# Patient Record
Sex: Female | Born: 1980 | Race: White | Hispanic: Yes | Marital: Single | Smoking: Never smoker
Health system: Southern US, Community
[De-identification: ages and names within clinical notes are randomized; demographics above are authoritative.]

---

## 2018-10-19 DIAGNOSIS — H5213 Myopia, bilateral: Secondary | ICD-10-CM | POA: Diagnosis not present

## 2018-10-19 DIAGNOSIS — H40033 Anatomical narrow angle, bilateral: Secondary | ICD-10-CM | POA: Diagnosis not present

## 2018-10-19 DIAGNOSIS — H16223 Keratoconjunctivitis sicca, not specified as Sjogren's, bilateral: Secondary | ICD-10-CM | POA: Diagnosis not present

## 2018-11-10 DIAGNOSIS — H1013 Acute atopic conjunctivitis, bilateral: Secondary | ICD-10-CM | POA: Diagnosis not present

## 2019-02-09 ENCOUNTER — Ambulatory Visit: Payer: Medicaid Other | Attending: Family Medicine | Admitting: Family Medicine

## 2019-02-09 ENCOUNTER — Encounter: Payer: Self-pay | Admitting: Family Medicine

## 2019-02-09 ENCOUNTER — Other Ambulatory Visit: Payer: Self-pay

## 2019-02-09 VITALS — BP 105/73 | HR 84 | Ht 62.0 in | Wt 181.0 lb

## 2019-02-09 DIAGNOSIS — Z79899 Other long term (current) drug therapy: Secondary | ICD-10-CM | POA: Diagnosis not present

## 2019-02-09 DIAGNOSIS — K219 Gastro-esophageal reflux disease without esophagitis: Secondary | ICD-10-CM | POA: Insufficient documentation

## 2019-02-09 DIAGNOSIS — Z131 Encounter for screening for diabetes mellitus: Secondary | ICD-10-CM | POA: Diagnosis not present

## 2019-02-09 LAB — POCT GLYCOSYLATED HEMOGLOBIN (HGB A1C): HbA1c, POC (controlled diabetic range): 5.5 % (ref 0.0–7.0)

## 2019-02-09 MED ORDER — OMEPRAZOLE 40 MG PO CPDR: 40.0000 mg | DELAYED_RELEASE_CAPSULE | Freq: Every day | ORAL | 3 refills | Status: DC

## 2019-02-09 NOTE — Patient Instructions (Signed)
Opciones de alimentos para pacientes adultos con enfermedad de reflujo gastroesofgico Food Choices for Gastroesophageal Reflux Disease, Adult Si tiene enfermedad de reflujo gastroesofgico (ERGE), los alimentos que consume y los hbitos de alimentacin son muy importantes. Elegir los alimentos adecuados puede ayudar a aliviar las molestias. Piense en consultar a un especialista en nutricin (nutricionista) para que lo ayude a hacer buenas elecciones. Consejos para seguir este plan  Comidas  Elija alimentos saludables con bajo contenido de grasa, como frutas, verduras, cereales integrales, productos lcteos descremados y carne magra de vaca, de pescado y de ave.  Haga comidas pequeas durante el da en lugar de 3 comidas abundantes. Coma lentamente y en un lugar donde est distendido. Evite agacharse o recostarse hasta 2 o 3horas despus de haber comido.  Evite comer 2 a 3horas antes de ir a acostarse.  Evite beber grandes cantidades de lquidos con las comidas.  Evite frer los alimentos a la hora de la coccin. Puede hornear, grillar o asar a la parrilla.  Evite o limite la cantidad de: ? Chocolate. ? Menta y mentol. ? Alcohol. ? Pimienta. ? Caf negro y descafeinado. ? T negro y descafeinado. ? Bebidas con gas (gaseosas). ? Bebidas energizantes y refrescos que contengan cafena.  Limite los alimentos con alto contenido de grasas, por ejemplo: ? Carnes grasas o alimentos fritos. ? Leche entera, crema, manteca o helado. ? Nueces y mantequillas de frutos secos. ? Pastelera, donas y dulces hechos con manteca o margarina.  Evite los alimentos que le ocasionen sntomas. Estos pueden ser distintos para cada persona. Los alimentos que suelen causan sntomas son los siguientes: ? Tomates. ? Naranjas, limones y limas. ? Pimientos. ? Comidas condimentadas. ? Cebolla y ajo. ? Vinagre. Estilo de vida  Mantenga un peso saludable. Pregntele a su mdico cul es el peso saludable  para usted. Si necesita perder peso, hable con su mdico para hacerlo de manera segura.  Realice actividad fsica durante, al menos, 30 minutos 5 das por semana o ms, o segn lo indicado por su mdico.  Use ropa suelta.  No fume. Si necesita ayuda para dejar de fumar, consulte al mdico.  Duerma con la cabecera de la cama ms elevada que los pies. Use una cua debajo del colchn o bloques debajo del armazn de la cama para mantener la cabecera de la cama elevada. Resumen  Si tiene enfermedad de reflujo gastroesofgico (ERGE), las elecciones de alimentos y el estilo de vida son muy importantes para ayudar a aliviar los sntomas.  Haga comidas pequeas durante el da en lugar de 3 comidas abundantes. Coma lentamente y en un lugar donde est distendido.  Limite los alimentos con alto contenido graso como la carne grasa o los alimentos fritos.  Evite agacharse o recostarse hasta 2 o 3horas despus de haber comido.  Evite la menta y hierba buena, la cafena, el alcohol y el chocolate. Esta informacin no tiene como fin reemplazar el consejo del mdico. Asegrese de hacerle al mdico cualquier pregunta que tenga. Document Revised: 08/19/2016 Document Reviewed: 08/19/2016 Elsevier Patient Education  2020 Elsevier Inc.  

## 2019-02-09 NOTE — Progress Notes (Signed)
New Patient Office Visit  Subjective:  Patient ID: Tammie Cook, female    DOB: 01-Feb-1980  Age: 39 y.o. MRN: [id number]  CC:  Chief Complaint  Patient presents with  . New Patient (Initial Visit)    HPI Tammie Cook is a 39 year old patient with no significant health history who presents today to establish care.  She does have an acute complaint of heartburn that occurs approximately 3 times per week.  She has not noticed an association with any particular foods and is not currently taking any medications to treat.    Denies tobacco, alcohol, and recreational drug use.  She is currently not getting exercise and has a diet high in sugar.  Due to her weight we will screen her for diabetes mellitus today.  This exam was completed with the help of an interpreter.  History reviewed. No pertinent past medical history.  Past Surgical History:  Procedure Laterality Date  . CESAREAN SECTION      Family History  Problem Relation Age of Onset  . Heart disease Mother     Social History   Socioeconomic History  . Marital status: Single    Spouse name: Not on file  . Number of children: Not on file  . Years of education: Not on file  . Highest education level: Not on file  Occupational History  . Not on file  Tobacco Use  . Smoking status: Never Smoker  . Smokeless tobacco: Never Used  Substance and Sexual Activity  . Alcohol use: Never  . Drug use: Never  . Sexual activity: Yes    Birth control/protection: Condom  Other Topics Concern  . Not on file  Social History Narrative  . Not on file   Social Determinants of Health   Financial Resource Strain:   . Difficulty of Paying Living Expenses: Not on file  Food Insecurity:   . Worried About Charity fundraiser in the Last Year: Not on file  . Ran Out of Food in the Last Year: Not on file  Transportation Needs:   . Lack of Transportation (Medical): Not on file  . Lack of Transportation (Non-Medical):  Not on file  Physical Activity:   . Days of Exercise per Week: Not on file  . Minutes of Exercise per Session: Not on file  Stress:   . Feeling of Stress : Not on file  Social Connections:   . Frequency of Communication with Friends and Family: Not on file  . Frequency of Social Gatherings with Friends and Family: Not on file  . Attends Religious Services: Not on file  . Active Member of Clubs or Organizations: Not on file  . Attends Archivist Meetings: Not on file  . Marital Status: Not on file  Intimate Partner Violence:   . Fear of Current or Ex-Partner: Not on file  . Emotionally Abused: Not on file  . Physically Abused: Not on file  . Sexually Abused: Not on file    ROS Review of Systems  Constitutional: Negative for fatigue, fever and unexpected weight change.  HENT: Negative for congestion, rhinorrhea, sinus pressure and sinus pain.   Eyes: Negative for visual disturbance.  Respiratory: Negative for cough, chest tightness and shortness of breath.   Cardiovascular: Negative for chest pain, palpitations and leg swelling.  Gastrointestinal: Negative for abdominal distention, abdominal pain, constipation, diarrhea, nausea and vomiting.       Occasional reflux  Endocrine: Negative for polydipsia and polyuria.  Genitourinary: Negative  for decreased urine volume, difficulty urinating and dysuria.  Musculoskeletal: Negative for arthralgias and myalgias.  Skin: Negative for color change and rash.  Neurological: Negative for dizziness, tremors, weakness and numbness.  Hematological: Does not bruise/bleed easily.  Psychiatric/Behavioral: Negative for agitation and behavioral problems.    Objective:   Today's Vitals: BP 105/73   Pulse 84   Ht 5\' 2"  (1.575 m)   Wt 181 lb (82.1 kg)   SpO2 97%   BMI 33.11 kg/m   Physical Exam Vitals and nursing note reviewed.  Constitutional:      Appearance: Normal appearance. She is not ill-appearing.  HENT:     Head:  Normocephalic and atraumatic.     Nose: Nose normal.  Eyes:     Extraocular Movements: Extraocular movements intact.     Conjunctiva/sclera: Conjunctivae normal.     Pupils: Pupils are equal, round, and reactive to light.  Cardiovascular:     Rate and Rhythm: Normal rate and regular rhythm.     Pulses: Normal pulses.     Heart sounds: Normal heart sounds. No murmur.  Pulmonary:     Effort: Pulmonary effort is normal. No respiratory distress.     Breath sounds: Normal breath sounds.  Abdominal:     General: Bowel sounds are normal. There is no distension.     Palpations: Abdomen is soft.     Tenderness: There is no abdominal tenderness.  Musculoskeletal:        General: No swelling or tenderness. Normal range of motion.     Cervical back: Normal range of motion and neck supple.  Skin:    General: Skin is warm and dry.     Findings: No rash.  Neurological:     Mental Status: She is alert and oriented to person, place, and time.  Psychiatric:        Mood and Affect: Mood normal.        Behavior: Behavior normal.     Assessment & Plan:   1. Screening for diabetes mellitus - HgB A1c  2. Gastroesophageal reflux disease without esophagitis Avoid any foods that worsen reflux symptoms Avoid lying down for two hours after eating Encouraged a healthy diet and 150 minutes of moderate intensity activity per week Begin Prilosec   Outpatient Encounter Medications as of 02/09/2019  Medication Sig  . omeprazole (PRILOSEC) 40 MG capsule Take 1 capsule (40 mg total) by mouth daily.   No facility-administered encounter medications on file as of 02/09/2019.    Follow-up: Return in about 1 month (around 03/12/2019) for complete physical.   03/14/2019, RN

## 2019-03-16 ENCOUNTER — Ambulatory Visit: Payer: Medicaid Other | Attending: Family Medicine | Admitting: Family Medicine

## 2019-03-16 ENCOUNTER — Encounter: Payer: Self-pay | Admitting: Family Medicine

## 2019-03-16 ENCOUNTER — Other Ambulatory Visit: Payer: Self-pay

## 2019-03-16 VITALS — BP 111/77 | HR 70 | Ht 62.0 in | Wt 180.0 lb

## 2019-03-16 DIAGNOSIS — Z79899 Other long term (current) drug therapy: Secondary | ICD-10-CM | POA: Diagnosis not present

## 2019-03-16 DIAGNOSIS — Z0000 Encounter for general adult medical examination without abnormal findings: Secondary | ICD-10-CM | POA: Diagnosis not present

## 2019-03-16 DIAGNOSIS — Z1159 Encounter for screening for other viral diseases: Secondary | ICD-10-CM

## 2019-03-16 NOTE — Progress Notes (Signed)
Patient had a Pap smear in 2019 in [place].

## 2019-03-16 NOTE — Progress Notes (Signed)
   Subjective:  Patient ID: Tammie Cook, female    DOB: [birth year]  Age: 39 y.o. MRN: [id number]  CC: Annual Exam and Gynecologic Exam   HPI Tammie Cook presents for a complete physical exam.  She denies FHx of  Breast cancer With regards to her Tdap she thinks she received it 8 years ago when  Declines screening for STD. Last PAP smear was normal in 2019.  No past medical history on file.  Past Surgical History:  Procedure Laterality Date  . CESAREAN SECTION      Family History  Problem Relation Age of Onset  . Heart disease Mother     No Known Allergies  Outpatient Medications Prior to Visit  Medication Sig Dispense Refill  . omeprazole (PRILOSEC) 40 MG capsule Take 1 capsule (40 mg total) by mouth daily. 30 capsule 3   No facility-administered medications prior to visit.     ROS Review of Systems General: negative for fever, weight loss, appetite change Eyes: no visual symptoms. ENT: no ear symptoms, no sinus tenderness, no nasal congestion or sore throat. Neck: no pain  Respiratory: no wheezing, shortness of breath, cough Cardiovascular: no chest pain, no dyspnea on exertion, no pedal edema, no orthopnea. Gastrointestinal: no abdominal pain, no diarrhea, no constipation Genito-Urinary: no urinary frequency, no dysuria, no polyuria. Hematologic: no bruising Endocrine: no cold or heat intolerance Neurological: no headaches, no seizures, no tremors Musculoskeletal: no joint pains, no joint swelling Skin: no pruritus, no rash. Psychological: no depression, no anxiety,    Objective:  BP 111/77   Pulse 70   Ht _0  (1.575 m)   Wt 180 lb (81.6 kg)   SpO2 100%   BMI 32.92 kg/m   BP/Weight 03/16/2019 05/05/8117  Systolic BP 147 829  Diastolic BP 77 73  Wt. (Lbs) 180 181  BMI 32.92 33.11      Physical Exam Constitutional: normal appearing,  Eyes: PERRLA HEENT: Head is atraumatic, normal sinuses, normal oropharynx, normal  appearing tonsils and palate, tympanic membrane is normal bilaterally. Neck: normal range of motion, no thyromegaly, no JVD Breast: normal appearance, no tenderness, no masses Cardiovascular: normal rate and rhythm, normal heart sounds, no murmurs, rub or gallop, no pedal edema Respiratory: Normal breath sounds, clear to auscultation bilaterally, no wheezes, no rales, no rhonchi Abdomen: soft, not tender to palpation, normal bowel sounds, no enlarged organs Musculoskeletal: Full ROM, no tenderness in joints Skin: warm and dry, no lesions. Neurological: alert, oriented x3, cranial nerves I-XII grossly intact , normal motor strength, normal sensation. Psychological: normal mood.   No flowsheet data found.  Lipid Panel  No results found for: CHOL, TRIG, HDL, CHOLHDL, VLDL, LDLCALC, LDLDIRECT  CBC No results found for: WBC, RBC, HGB, HCT, PLT, MCV, MCH, MCHC, RDW, LYMPHSABS, MONOABS, EOSABS, BASOSABS  Lab Results  Component Value Date   HGBA1C 5.5 02/09/2019    Assessment & Plan:   1. Annual physical exam Counseled on 150 minutes of exercise per week, healthy eating (including decreased daily intake of saturated fats, cholesterol, added sugars, sodium), STI prevention, routine healthcare maintenance.  - CMP14+EGFR - CBC with Differential/Platelet - T4, free - TSH - Lipid panel  2. Screening for viral disease - HIV Antibody (routine testing w rflx)   Return in about 6 months (around 09/13/2019) for GERD.   Charlott Rakes, MD, FAAFP. [facility], [place]   03/16/2019, 10:06 AM

## 2019-03-17 ENCOUNTER — Telehealth: Payer: Self-pay

## 2019-03-17 LAB — CBC WITH DIFFERENTIAL/PLATELET
Basophils Absolute: 0.1 10*3/uL (ref 0.0–0.2)
Basos: 1 %
EOS (ABSOLUTE): 0.1 10*3/uL (ref 0.0–0.4)
Eos: 2 %
Hematocrit: 39.7 % (ref 34.0–46.6)
Hemoglobin: 13.2 g/dL (ref 11.1–15.9)
Immature Grans (Abs): 0 10*3/uL (ref 0.0–0.1)
Immature Granulocytes: 0 %
Lymphocytes Absolute: 1.8 10*3/uL (ref 0.7–3.1)
Lymphs: 27 %
MCH: 28.8 pg (ref 26.6–33.0)
MCHC: 33.2 g/dL (ref 31.5–35.7)
MCV: 87 fL (ref 79–97)
Monocytes Absolute: 0.6 10*3/uL (ref 0.1–0.9)
Monocytes: 9 %
Neutrophils Absolute: 4.2 10*3/uL (ref 1.4–7.0)
Neutrophils: 61 %
Platelets: 246 10*3/uL (ref 150–450)
RBC: 4.58 x10E6/uL (ref 3.77–5.28)
RDW: 12.7 % (ref 11.7–15.4)
WBC: 6.8 10*3/uL (ref 3.4–10.8)

## 2019-03-17 LAB — LIPID PANEL
Chol/HDL Ratio: 3.9 ratio (ref 0.0–4.4)
Cholesterol, Total: 178 mg/dL (ref 100–199)
HDL: 46 mg/dL (ref >39)
LDL Chol Calc (NIH): 113 mg/dL — ABNORMAL HIGH (ref 0–99)
Triglycerides: 106 mg/dL (ref 0–149)
VLDL Cholesterol Cal: 19 mg/dL (ref 5–40)

## 2019-03-17 LAB — CMP14+EGFR
ALT: 13 IU/L (ref 0–32)
AST: 14 IU/L (ref 0–40)
Albumin/Globulin Ratio: 1.5 (ref 1.2–2.2)
Albumin: 4.4 g/dL (ref 3.8–4.8)
Alkaline Phosphatase: 43 IU/L (ref 39–117)
BUN/Creatinine Ratio: 16 (ref 9–23)
BUN: 10 mg/dL (ref 6–20)
Bilirubin Total: 0.4 mg/dL (ref 0.0–1.2)
CO2: 21 mmol/L (ref 20–29)
Calcium: 9.3 mg/dL (ref 8.7–10.2)
Chloride: 103 mmol/L (ref 96–106)
Creatinine, Ser: 0.61 mg/dL (ref 0.57–1.00)
GFR calc Af Amer: 132 mL/min/{1.73_m2} (ref >59)
GFR calc non Af Amer: 115 mL/min/{1.73_m2} (ref >59)
Globulin, Total: 3 g/dL (ref 1.5–4.5)
Glucose: 98 mg/dL (ref 65–99)
Potassium: 4.6 mmol/L (ref 3.5–5.2)
Sodium: 136 mmol/L (ref 134–144)
Total Protein: 7.4 g/dL (ref 6.0–8.5)

## 2019-03-17 LAB — TSH: TSH: 0.647 u[IU]/mL (ref 0.450–4.500)

## 2019-03-17 LAB — T4, FREE: Free T4: 1.38 ng/dL (ref 0.82–1.77)

## 2019-03-17 LAB — HIV ANTIBODY (ROUTINE TESTING W REFLEX): HIV Screen 4th Generation wRfx: NONREACTIVE

## 2019-03-17 NOTE — Telephone Encounter (Signed)
-----   Message from Enobong Newlin, MD sent at 03/17/2019  2:21 PM EST ----- Please inform the patient that labs are normal. Thank you. 

## 2019-03-17 NOTE — Telephone Encounter (Signed)
Patient name and DOB has been verified Patient was informed of lab results. Patient had no questions.  

## 2019-05-17 ENCOUNTER — Emergency Department (HOSPITAL_COMMUNITY): Payer: Medicaid Other

## 2019-05-17 ENCOUNTER — Other Ambulatory Visit: Payer: Self-pay

## 2019-05-17 ENCOUNTER — Emergency Department (HOSPITAL_COMMUNITY)
Admission: EM | Admit: 2019-05-17 | Discharge: 2019-05-17 | Disposition: A | Discharge status: Home / Self Care | Payer: Medicaid Other | Attending: Emergency Medicine | Admitting: Emergency Medicine

## 2019-05-17 ENCOUNTER — Encounter (HOSPITAL_COMMUNITY): Payer: Self-pay

## 2019-05-17 DIAGNOSIS — S161XXA Strain of muscle, fascia and tendon at neck level, initial encounter: Secondary | ICD-10-CM | POA: Insufficient documentation

## 2019-05-17 DIAGNOSIS — H43393 Other vitreous opacities, bilateral: Secondary | ICD-10-CM | POA: Diagnosis not present

## 2019-05-17 DIAGNOSIS — M545 Low back pain, unspecified: Secondary | ICD-10-CM

## 2019-05-17 DIAGNOSIS — S299XXA Unspecified injury of thorax, initial encounter: Secondary | ICD-10-CM | POA: Diagnosis not present

## 2019-05-17 DIAGNOSIS — Y92410 Unspecified street and highway as the place of occurrence of the external cause: Secondary | ICD-10-CM | POA: Insufficient documentation

## 2019-05-17 DIAGNOSIS — S199XXA Unspecified injury of neck, initial encounter: Secondary | ICD-10-CM | POA: Diagnosis present

## 2019-05-17 DIAGNOSIS — S3992XA Unspecified injury of lower back, initial encounter: Secondary | ICD-10-CM | POA: Diagnosis not present

## 2019-05-17 DIAGNOSIS — Y93I9 Activity, other involving external motion: Secondary | ICD-10-CM | POA: Insufficient documentation

## 2019-05-17 DIAGNOSIS — M25531 Pain in right wrist: Secondary | ICD-10-CM | POA: Diagnosis not present

## 2019-05-17 DIAGNOSIS — M542 Cervicalgia: Secondary | ICD-10-CM | POA: Diagnosis not present

## 2019-05-17 DIAGNOSIS — Y999 Unspecified external cause status: Secondary | ICD-10-CM | POA: Diagnosis not present

## 2019-05-17 DIAGNOSIS — R0789 Other chest pain: Secondary | ICD-10-CM | POA: Diagnosis not present

## 2019-05-17 DIAGNOSIS — S6991XA Unspecified injury of right wrist, hand and finger(s), initial encounter: Secondary | ICD-10-CM | POA: Diagnosis not present

## 2019-05-17 DIAGNOSIS — Z79899 Other long term (current) drug therapy: Secondary | ICD-10-CM | POA: Diagnosis not present

## 2019-05-17 MED ORDER — CYCLOBENZAPRINE HCL 10 MG PO TABS: 10.0000 mg | ORAL_TABLET | Freq: Two times a day (BID) | ORAL | 0 refills | Status: DC | PRN

## 2019-05-17 MED ORDER — IBUPROFEN 600 MG PO TABS: 600.0000 mg | ORAL_TABLET | Freq: Four times a day (QID) | ORAL | 0 refills | Status: DC | PRN

## 2019-05-17 MED ORDER — IBUPROFEN 800 MG PO TABS
800.0000 mg | ORAL_TABLET | Freq: Once | ORAL | Status: AC
  Administered 2019-05-17: 800 mg via ORAL
  Filled 2019-05-17: qty 1
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
## 2019-05-17 NOTE — Discharge Instructions (Addendum)
Take Flexeril and Motrin as needed as prescribed. Do not drive if taking Flexeril. Apply warm compresses to neck and back for 20 minutes three times daily. Apply ice to wrist for 20 minutes three times daily. Follow up with your doctor for recheck. Return to ER for new or worsening symptoms.

## 2019-05-25 ENCOUNTER — Other Ambulatory Visit: Payer: Self-pay

## 2019-05-25 ENCOUNTER — Ambulatory Visit: Payer: Medicaid Other | Attending: Family Medicine | Admitting: Family Medicine

## 2019-05-25 ENCOUNTER — Encounter: Payer: Self-pay | Admitting: Family Medicine

## 2019-05-25 VITALS — BP 113/76 | HR 79 | Ht 62.0 in | Wt 178.4 lb

## 2019-05-25 DIAGNOSIS — M545 Low back pain, unspecified: Secondary | ICD-10-CM

## 2019-05-25 DIAGNOSIS — M542 Cervicalgia: Secondary | ICD-10-CM | POA: Insufficient documentation

## 2019-05-25 DIAGNOSIS — M25531 Pain in right wrist: Secondary | ICD-10-CM | POA: Insufficient documentation

## 2019-05-25 DIAGNOSIS — M25551 Pain in right hip: Secondary | ICD-10-CM | POA: Insufficient documentation

## 2019-05-25 DIAGNOSIS — Z79899 Other long term (current) drug therapy: Secondary | ICD-10-CM | POA: Diagnosis not present

## 2019-05-25 MED ORDER — TRAMADOL HCL 50 MG PO TABS: 50.0000 mg | ORAL_TABLET | Freq: Every day | ORAL | 0 refills | Status: DC

## 2019-05-25 MED ORDER — CYCLOBENZAPRINE HCL 10 MG PO TABS: 10.0000 mg | ORAL_TABLET | Freq: Two times a day (BID) | ORAL | 1 refills | Status: DC | PRN

## 2019-05-25 MED ORDER — IBUPROFEN 600 MG PO TABS: 600.0000 mg | ORAL_TABLET | Freq: Three times a day (TID) | ORAL | 1 refills | Status: DC | PRN
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
## 2019-05-25 NOTE — Patient Instructions (Signed)
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
## 2019-06-17 ENCOUNTER — Telehealth: Payer: Self-pay

## 2019-06-17 NOTE — Telephone Encounter (Signed)
PRIOR AUTH FOR TRAMADOL IS APPROVED THRU 12/14/2019

## 2019-07-14 ENCOUNTER — Other Ambulatory Visit: Payer: Self-pay

## 2019-07-14 ENCOUNTER — Ambulatory Visit: Payer: Medicaid Other | Attending: Family Medicine | Admitting: Family Medicine

## 2019-07-14 ENCOUNTER — Encounter: Payer: Self-pay | Admitting: Family Medicine

## 2019-07-14 VITALS — BP 116/82 | HR 66 | Ht 62.0 in | Wt 179.6 lb

## 2019-07-14 DIAGNOSIS — M25532 Pain in left wrist: Secondary | ICD-10-CM | POA: Diagnosis not present

## 2019-07-14 DIAGNOSIS — M542 Cervicalgia: Secondary | ICD-10-CM

## 2019-07-14 MED ORDER — PREDNISONE 20 MG PO TABS: 20.0000 mg | ORAL_TABLET | Freq: Every day | ORAL | 0 refills | Status: DC

## 2019-07-14 MED ORDER — OMEPRAZOLE 40 MG PO CPDR: 40.0000 mg | DELAYED_RELEASE_CAPSULE | Freq: Every day | ORAL | 3 refills | Status: DC
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
## 2019-07-14 NOTE — Patient Instructions (Signed)
Dolor en la [employer] adultos Wrist Pain, Adult Muchos factores pueden causar dolor en la [place]. Algunas causas frecuentes son las siguientes:  Una lesin en la zona de la [place].  Uso excesivo de Nurse, learning disability.  Una afeccin que causa mucha presin en un nervio de la mueca (sndrome del tnel carpiano).  El desgaste normal de las articulaciones que ocurre con la edad (artrosis).  Otros tipos de artritis. A veces, la causa del dolor en la mueca no se conoce. Con frecuencia, el dolor desaparece cuando sigue las indicaciones del mdico para Barrister's clerk, Advertising account executive o Scientific laboratory technician hielo en la [place]. Si el dolor de [place] no desaparece, es importante que le avise al mdico. Siga estas indicaciones en su casa:  Haga descansar la zona de la mueca durante 48horas o ms, o como se lo haya indicado el mdico.  Si le colocaron una frula o una venda elstica en la [place], selas como se lo haya indicado el mdico. ? Quteselos solamente segn las indicaciones del mdico. ? Afloje la frula o la venda si tiene sensacin de hormigueo (adormecimiento) o si los dedos se vuelven fros o azulados.  Si se lo indican, aplique hielo sobre la zona lesionada: ? Si tiene una frula desmontable o una venda elstica, quteselas segn las indicaciones del mdico. ? Ponga el hielo en una bolsa plstica. ? Coloque una [employer] piel y la bolsa de hielo o entre la frula o la venda y la bolsa de hielo. ? Coloque el hielo durante , 2 o 3veces por da.   Cuando est sentado o acostado, mantenga el brazo elevado por encima del nivel del corazn.  Tome los medicamentos de venta libre y los recetados solamente como se lo haya indicado el mdico.  Oceanographer a todas las visitas de control como se lo haya indicado el mdico. Esto es importante. Comunquese con un mdico si:  Tiene un dolor agudo repentino en la [place], la mano o el brazo que es diferente o [place].  La  hinchazn o los hematomas en la mueca o la mano empeoran.  La piel se enrojece, aparece una erupcin o tiene Advertising copywriter.  El dolor no mejora o [place]. Solicite ayuda de inmediato si:  Pierde la sensibilidad en la mano o en los dedos de la [place].  Los dedos se tornan de color blanco, estn muy enrojecidos o estn fros y azulados.  No puede mover los dedos.  Tiene fiebre o siente escalofros. Esta informacin no tiene Theme park manager el consejo del mdico. Asegrese de hacerle al mdico cualquier pregunta que tenga. Document Revised: 04/23/2016 Document Reviewed: 08/02/2015 Elsevier Patient Education  2020 [employer].
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
## 2019-07-15 ENCOUNTER — Encounter: Payer: Self-pay | Admitting: Family Medicine

## 2019-07-19 ENCOUNTER — Encounter: Payer: Self-pay | Admitting: Physical Therapy

## 2019-07-19 ENCOUNTER — Ambulatory Visit: Payer: Medicaid Other | Attending: Family Medicine | Admitting: Physical Therapy

## 2019-07-19 ENCOUNTER — Other Ambulatory Visit: Payer: Self-pay

## 2019-07-19 DIAGNOSIS — M25532 Pain in left wrist: Secondary | ICD-10-CM | POA: Diagnosis not present

## 2019-07-19 DIAGNOSIS — M542 Cervicalgia: Secondary | ICD-10-CM | POA: Diagnosis not present
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
## 2019-07-29 ENCOUNTER — Ambulatory Visit: Payer: Medicaid Other

## 2019-08-02 ENCOUNTER — Ambulatory Visit: Payer: Medicaid Other | Attending: Family Medicine | Admitting: Physical Therapy

## 2019-08-02 ENCOUNTER — Other Ambulatory Visit: Payer: Self-pay

## 2019-08-02 DIAGNOSIS — M25532 Pain in left wrist: Secondary | ICD-10-CM | POA: Diagnosis present

## 2019-08-02 DIAGNOSIS — M542 Cervicalgia: Secondary | ICD-10-CM | POA: Insufficient documentation

## 2019-08-02 NOTE — Therapy (Signed)
[facility] Outpatient Rehabilitation [facility] [address] [place], [place], [address] Phone: [phone]   Fax:  [phone]  Physical Therapy Treatment  Patient Details  Name: Tammie Cook MRN: [id number] Date of Birth: 1980/10/15 Referring Provider (PT): Hoy Register, MD   Encounter Date: 08/02/2019   PT End of Session - 08/02/19 0741    Visit Number 2    Number of Visits 12    Date for PT Re-Evaluation 09/06/19    Authorization Type Medicaid-requesting authorization for initial 3 treatment visits    Authorization - Visit Number 2    Authorization - Number of Visits 4    PT Start Time 0745    PT Stop Time 0836    PT Time Calculation (min) 51 min    Activity Tolerance Patient limited by pain    Behavior During Therapy [facility] for tasks assessed/performed           No past medical history on file.  Past Surgical History:  Procedure Laterality Date  . CESAREAN SECTION      There were no vitals filed for this visit.   Subjective Assessment - 08/02/19 0743    Subjective Pt speaks through interpreter. Pt states pain is so so and just a tad better. Pt reports doing exercises daily. Pt reports Lt wrist continues to hurt the most. Pt reports she has been doing some neck flexing. Neck pain is almost the same.    Patient is accompained by: Tammie Cook [phone]   Pertinent History no other significant PMH    Limitations House hold activities;Lifting    Diagnostic tests Cervical CT, X-rays for left wrist, lumbar spine and sternum    Currently in Pain? Yes    Pain Score 4     Pain Onset More than a month ago    Pain Onset More than a month ago                             [facility] Adult PT Treatment/Exercise - 08/02/19 0001      Neck Exercises: Supine   Neck Retraction 3 secs;20 reps      Shoulder Exercises: Seated   Retraction Strengthening;Both;10 reps    Other Seated Exercises neck + scapular retraction x 10 reps      Wrist  Exercises   Wrist Flexion AAROM;5 reps   pain and edema limiting   Wrist Extension PROM;Left   stretch into extension for wrist flexors   Other wrist exercises elevated gentle hand pumps x 10 reps   limited due to pain     Modalities   Modalities Iontophoresis      Iontophoresis   Type of Iontophoresis Dexamethasone    Location L wrist    Dose 80 mA/hr    Time 4 hr      Manual Therapy   Manual Therapy Soft tissue mobilization;Myofascial release;Taping    Soft tissue mobilization R upper trap, cervical paraspinals and levator, L wrist flexor muscle belly    Myofascial Release R upper trap and cervical paraspinals    Kinesiotex Inhibit Muscle      Kinesiotix   Inhibit Muscle  R upper trap      Neck Exercises: Stretches   Upper Trapezius Stretch Right;20 seconds    Levator Stretch Right;20 seconds                  PT Education - 08/02/19 0939    Education  Details Discussed continuing to ice wrist for swelling. Discussed self massage for trigger points along L wrist flexor muscle belly and self trigger point release with tennis ball for R upper trap.    Person(s) Educated Patient    Methods Explanation;Demonstration;Verbal cues    Comprehension Verbalized understanding            PT Short Term Goals - 07/19/19 1538      PT SHORT TERM GOAL #1   Title Independent with initial HEP    Baseline instructed 07/19/19    Time 3    Period Weeks    Status New    Target Date 08/09/19      PT SHORT TERM GOAL #2   Title Increase right cervical rotation AROM at least 10 deg to improve ability to turn head while driving    Baseline 35 deg    Time 3    Period Weeks    Status New    Target Date 08/09/19      PT SHORT TERM GOAL #3   Title Increase left grip strength to 15 lbs. or greater to improve ability for gripping, lifting activities for chores    Baseline 6 lbs.    Time 3    Period Weeks    Status New    Target Date 08/09/19             PT Long Term Goals -  07/19/19 1540      PT LONG TERM GOAL #1   Title Increase bilateral cervical rotation AROM to at least 60 deg to improve ability to turn head while driving    Baseline right 35 deg, left 50 deg    Time 6    Period Weeks    Status New    Target Date 09/06/19      PT LONG TERM GOAL #2   Title Increase right shoulder strength at least grossly 1/2 MMT grade to improve ability for lifting for chores, carrying groceries    Baseline 4/5    Time 6    Period Weeks    Status New    Target Date 09/06/19      PT LONG TERM GOAL #3   Title Independent with advanced HEP for continued progres after d/c from formal therapy    Time 6    Period Weeks    Status New    Target Date 09/06/19      PT LONG TERM GOAL #4   Title Perform ADLs/IADLs and chores with neck and left wrist pain consistently 3/10 or less    Baseline 7/10 for wrist, neck pain 4/10    Time 6    Period Weeks    Status New    Target Date 09/06/19                 Plan - 08/02/19 0934    Clinical Impression Statement Treatment focused on gentle stretching, AROM, manual therapy for R upper trap and L wrist, and initiation of neck and shoulder stabilization exercises. PT provided KT tape to assist with inhibiting upper trap activation and ionto for pt's L wrist pain. Unable to tolerate manual traction.    Personal Factors and Comorbidities Time since onset of injury/illness/exacerbation   mechanism of onset and multiple tx. areas   Examination-Activity Limitations Lift;Carry;Sleep    Examination-Participation Restrictions Cleaning;Driving    Stability/Clinical Decision Making Evolving/Moderate complexity    Rehab Potential Good    PT Frequency 2x / week  PT Duration 6 weeks    PT Treatment/Interventions ADLs/Self Care Home Management;Cryotherapy;Traction;Ultrasound;Electrical Stimulation;Iontophoresis 4mg /ml Dexamethasone;Moist Heat;Therapeutic activities;Therapeutic exercise;Patient/family education;Manual  techniques;Spinal Manipulations;Dry needling;Taping    PT Next Visit Plan Assess response to new exercises, taping and ionto. Consider to dorsal/radial aspect of wrist. Continue gentle wrist ROM and stretches as tolerated, progress neck stabilization exercises as tolerated, STM right upper trapezius region, manual vs. mechanical traction if tolerated, upper trap/levator stretches, pending time and tolerance possible Theraband periscapular exercises    PT Home Exercise Plan [id number]    Consulted and Agree with Plan of Care Patient           Patient will benefit from skilled therapeutic intervention in order to improve the following deficits and impairments:  Pain, Postural dysfunction, Impaired flexibility, Decreased strength, Decreased activity tolerance, Decreased range of motion, Increased edema, Increased muscle spasms, Impaired UE functional use  Visit Diagnosis: Cervicalgia  Pain in left wrist     Problem List There are no problems to display for this patient.   [facility] [address] PT, DPT 08/02/2019, 9:42 AM  [facility] [address] [place], [place], [place] Phone: [phone]   Fax:  [phone]  Name: Tammie Cook MRN: Elsie Amis Date of Birth: 1981/01/26

## 2019-08-04 ENCOUNTER — Ambulatory Visit: Payer: Medicaid Other | Admitting: Physical Therapy

## 2019-08-04 ENCOUNTER — Other Ambulatory Visit: Payer: Self-pay

## 2019-08-04 DIAGNOSIS — M542 Cervicalgia: Secondary | ICD-10-CM

## 2019-08-04 DIAGNOSIS — M25532 Pain in left wrist: Secondary | ICD-10-CM

## 2019-08-04 NOTE — Therapy (Signed)
[facility] Outpatient Rehabilitation [facility] [address] [place], [place], [address] Phone: [phone]   Fax:  [phone]  Physical Therapy Treatment  Patient Details  Name: Tammie Cook MRN: [id number] Date of Birth: 1980-03-29 Referring Provider (PT): Hoy Register, MD   Encounter Date: 08/04/2019   PT End of Session - 08/04/19 0740    Visit Number 3    Number of Visits 12    Date for PT Re-Evaluation 09/06/19    Authorization Type Medicaid-requesting authorization for initial 3 treatment visits    Authorization - Visit Number 3    Authorization - Number of Visits 4    PT Start Time 0745    PT Stop Time 0835    PT Time Calculation (min) 50 min    Activity Tolerance Patient limited by pain    Behavior During Therapy [facility] for tasks assessed/performed           No past medical history on file.  Past Surgical History:  Procedure Laterality Date  . CESAREAN SECTION      There were no vitals filed for this visit.   Subjective Assessment - 08/04/19 0747    Subjective Pt speaking through interpreter. Pt states that exercises did help out a lot but pain mostly centered around her wrist. Pt states that pain patch was fine and she took it off yesterday. Pt reports neck feels more like tension with a little pain.    Patient is accompained by: Irving Shows [phone]   Pertinent History no other significant PMH    Limitations House hold activities;Lifting    Diagnostic tests Cervical CT, X-rays for left wrist, lumbar spine and sternum    Currently in Pain? No/denies    Pain Onset More than a month ago    Pain Score 6    Pain Location Wrist    Pain Onset More than a month ago                             [facility] Adult PT Treatment/Exercise - 08/04/19 0001      Neck Exercises: Seated   Neck Retraction 10 reps    Other Seated Exercise First rib mobilization x 10 reps with 3 sec hold    Other Seated Exercise Self mobilization neck  rotation x 10 reps      Shoulder Exercises: Seated   Retraction Strengthening;Both;10 reps      Wrist Exercises   Wrist Flexion AAROM;10 reps    Wrist Extension AAROM;Left;10 reps    Other wrist exercises gripping yellow theraputty x 10      Modalities   Modalities Ultrasound      Ultrasound   Ultrasound Location L wrist    Ultrasound Parameters 2.2 w/cm    Ultrasound Goals Pain;Edema      Manual Therapy   Manual Therapy Soft tissue mobilization;Myofascial release;Taping    Soft tissue mobilization R upper trap, cervical paraspinals and levator, L wrist flexor muscle belly    Kinesiotex Inhibit Muscle      Kinesiotix   Inhibit Muscle  R upper trap and L wrist flexor      Neck Exercises: Stretches   Upper Trapezius Stretch Right;60 seconds    Levator Stretch Right;30 seconds                    PT Short Term Goals - 07/19/19 1538      PT SHORT TERM GOAL #1  Title Independent with initial HEP    Baseline instructed 07/19/19    Time 3    Period Weeks    Status New    Target Date 08/09/19      PT SHORT TERM GOAL #2   Title Increase right cervical rotation AROM at least 10 deg to improve ability to turn head while driving    Baseline 35 deg    Time 3    Period Weeks    Status New    Target Date 08/09/19      PT SHORT TERM GOAL #3   Title Increase left grip strength to 15 lbs. or greater to improve ability for gripping, lifting activities for chores    Baseline 6 lbs.    Time 3    Period Weeks    Status New    Target Date 08/09/19             PT Long Term Goals - 07/19/19 1540      PT LONG TERM GOAL #1   Title Increase bilateral cervical rotation AROM to at least 60 deg to improve ability to turn head while driving    Baseline right 35 deg, left 50 deg    Time 6    Period Weeks    Status New    Target Date 09/06/19      PT LONG TERM GOAL #2   Title Increase right shoulder strength at least grossly 1/2 MMT grade to improve ability for  lifting for chores, carrying groceries    Baseline 4/5    Time 6    Period Weeks    Status New    Target Date 09/06/19      PT LONG TERM GOAL #3   Title Independent with advanced HEP for continued progres after d/c from formal therapy    Time 6    Period Weeks    Status New    Target Date 09/06/19      PT LONG TERM GOAL #4   Title Perform ADLs/IADLs and chores with neck and left wrist pain consistently 3/10 or less    Baseline 7/10 for wrist, neck pain 4/10    Time 6    Period Weeks    Status New    Target Date 09/06/19                 Plan - 08/04/19 0954    Clinical Impression Statement Pt with improving neck pain. Wrist pain is pt's primary complaint. Pt with less edema this session (pt has been using wrist brace and she reports some improvement with pain while using this) with increased wrist AROM prior to pain; however, end range wrist flexion continues to be painful and dorsal wrist TTP. Treatment focused on continued manual and stretching for wrist & neck. Provided [place] to wrist and KT tape. Pt found to have elevated first rib and provided first rib self mobilization.    Personal Factors and Comorbidities Time since onset of injury/illness/exacerbation   mechanism of onset and multiple tx. areas   Examination-Activity Limitations Lift;Carry;Sleep    Examination-Participation Restrictions Cleaning;Driving    Stability/Clinical Decision Making Evolving/Moderate complexity    Rehab Potential Good    PT Frequency 2x / week    PT Duration 6 weeks    PT Treatment/Interventions ADLs/Self Care Home Management;Cryotherapy;Traction;Ultrasound;Electrical Stimulation;Iontophoresis 4mg /ml Dexamethasone;Moist Heat;Therapeutic activities;Therapeutic exercise;Patient/family education;Manual techniques;Spinal Manipulations;Dry needling;Taping    PT Next Visit Plan Assess response to new exercises, taping. if indicated to dorsal/radial aspect  of wrist. Continue gentle wrist ROM and  stretches as tolerated, progress neck stabilization exercises as tolerated, STM right upper trapezius region, manual vs. mechanical traction if tolerated, upper trap/levator stretches, pending time and tolerance possible theraband periscapular exercises. Consider DN.    PT Home Exercise Plan [id number]    Consulted and Agree with Plan of Care Patient           Patient will benefit from skilled therapeutic intervention in order to improve the following deficits and impairments:  Pain, Postural dysfunction, Impaired flexibility, Decreased strength, Decreased activity tolerance, Decreased range of motion, Increased edema, Increased muscle spasms, Impaired UE functional use  Visit Diagnosis: Cervicalgia  Pain in left wrist     Problem List There are no problems to display for this patient.   Skylene Deremer April Ma L Rilla Buckman PT, DPT 08/04/2019, 9:58 AM  [facility] [address] [place], [place], [address] Phone: [phone]   Fax:  [phone]  Name: Charlie Char MRN: [id number] Date of Birth: 09/20/1980

## 2019-08-16 ENCOUNTER — Other Ambulatory Visit: Payer: Self-pay

## 2019-08-16 ENCOUNTER — Encounter: Payer: Self-pay | Admitting: Physical Therapy

## 2019-08-16 ENCOUNTER — Ambulatory Visit: Payer: Medicaid Other | Admitting: Physical Therapy

## 2019-08-16 DIAGNOSIS — M542 Cervicalgia: Secondary | ICD-10-CM | POA: Diagnosis not present

## 2019-08-16 DIAGNOSIS — M25532 Pain in left wrist: Secondary | ICD-10-CM
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
## 2019-08-19 ENCOUNTER — Other Ambulatory Visit: Payer: Self-pay

## 2019-08-19 ENCOUNTER — Ambulatory Visit: Payer: Medicaid Other | Admitting: Physical Therapy

## 2019-08-19 DIAGNOSIS — M25532 Pain in left wrist: Secondary | ICD-10-CM

## 2019-08-19 DIAGNOSIS — M542 Cervicalgia: Secondary | ICD-10-CM

## 2019-08-19 NOTE — Therapy (Signed)
[place], [place], [address] Phone: [phone]   Fax:  [phone]  Physical Therapy Treatment  Patient Details  Name: Tammie Cook MRN: [id number] Date of Birth: March 31, 1980 Referring Provider (PT): Charlott Rakes, MD   Encounter Date: 08/19/2019   PT End of Session - 08/19/19 0737    Visit Number 5    Number of Visits 12    Date for PT Re-Evaluation 09/06/19    Authorization Type Medicaid-27 visit max    PT Start Time 0745    PT Stop Time 0832    PT Time Calculation (min) 47 min    Activity Tolerance Patient limited by pain    Behavior During Therapy [facility] for tasks assessed/performed           No past medical history on file.  Past Surgical History:  Procedure Laterality Date  . CESAREAN SECTION      There were no vitals filed for this visit.   Subjective Assessment - 08/19/19 0745    Subjective Pt states she still hasn't talked to the doctor yet. Pt reports wrist pain has decreased a little. Pt reports neck pain has decreased    Patient is accompained by: Interpreter   Manny [phone]   Pain Onset More than a month ago                             [facility] Adult PT Treatment/Exercise - 08/19/19 0001      Neck Exercises: Seated   Other Seated Exercise Cervical retraction with yellow tband    Other Seated Exercise shoulder ER with yellow tband x 10 reps, scapular retraction x 10      Neck Exercises: Supine   Other Supine Exercise cervical retraction in neutral x 10, in extension x 10, maintain retraction with rotation x 10      Shoulder Exercises: Standing   Other Standing Exercises low trap setting from wall 2 x 10      Iontophoresis   Type of Iontophoresis Dexamethasone    Location radial aspect of dorsum of left wrist    Dose 80 mA/hr    Time 4 hr      Manual Therapy   Manual Therapy Soft tissue mobilization    Soft tissue mobilization upper trap, SCM,  anterior scalene      Neck Exercises: Stretches   Upper Trapezius Stretch Right;3 reps;20 seconds    Other Neck Stretches SCM stretch 2 x 30 sec    Other Neck Stretches Cervical rotation MWM x 10                  PT Education - 08/19/19 0840    Education Details Discussed using theracane, heat and stretches for any next day soreness. Reinforced MD follow-up.    Person(s) Educated Patient    Methods Explanation;Demonstration;Verbal cues    Comprehension Verbalized understanding;Returned demonstration            PT Short Term Goals - 08/19/19 0841      PT SHORT TERM GOAL #1   Title Independent with initial HEP    Baseline instructed 07/19/19    Time 3    Period Weeks    Status Achieved    Target Date 08/09/19      PT SHORT TERM GOAL #2   Title Increase right cervical rotation AROM at least 10 deg to improve ability to turn head while driving  Baseline 35 deg    Time 3    Period Weeks    Status Partially Met    Target Date 08/09/19      PT SHORT TERM GOAL #3   Title Increase left grip strength to 15 lbs. or greater to improve ability for gripping, lifting activities for chores    Baseline 6 lbs.    Time 3    Period Weeks    Status On-going    Target Date 08/09/19             PT Long Term Goals - 07/19/19 1540      PT LONG TERM GOAL #1   Title Increase bilateral cervical rotation AROM to at least 60 deg to improve ability to turn head while driving    Baseline right 35 deg, left 50 deg    Time 6    Period Weeks    Status New    Target Date 09/06/19      PT LONG TERM GOAL #2   Title Increase right shoulder strength at least grossly 1/2 MMT grade to improve ability for lifting for chores, carrying groceries    Baseline 4/5    Time 6    Period Weeks    Status New    Target Date 09/06/19      PT LONG TERM GOAL #3   Title Independent with advanced HEP for continued progres after d/c from formal therapy    Time 6    Period Weeks    Status New     Target Date 09/06/19      PT LONG TERM GOAL #4   Title Perform ADLs/IADLs and chores with neck and left wrist pain consistently 3/10 or less    Baseline 7/10 for wrist, neck pain 4/10    Time 6    Period Weeks    Status New    Target Date 09/06/19                 Plan - 08/19/19 0843    Clinical Impression Statement Reinforced to pt to follow up with her MD for further wrist imaging/assessment. Pt's neck pain continues to improve with manual therapy, stretching, and progressing her DNF strengthening program. Trigger points found along traps, R scalene and SCM muscles. Treatment focused primarily on pt's neck due to PT recommendations for MD follow-up; pt requesting ionto for her wrist pain.    Personal Factors and Comorbidities Time since onset of injury/illness/exacerbation    Examination-Activity Limitations Lift;Carry;Sleep    Examination-Participation Restrictions Cleaning;Driving    Stability/Clinical Decision Making Evolving/Moderate complexity    Rehab Potential Good    PT Frequency 2x / week    PT Duration 6 weeks    PT Treatment/Interventions ADLs/Self Care Home Management;Cryotherapy;Traction;Ultrasound;Electrical Stimulation;Iontophoresis 62m/ml Dexamethasone;Moist Heat;Therapeutic activities;Therapeutic exercise;Patient/family education;Manual techniques;Spinal Manipulations;Dry needling;Taping    PT Next Visit Plan Assess response to HEP. Check if pt with any MD follow-up. Continue stretches and manual for cervical/upper trap muscles, add postural strengthening with monitoring of left wrist pain for any gripping required for bands    PT Home Exercise Plan [id number]    Consulted and Agree with Plan of Care Patient           Patient will benefit from skilled therapeutic intervention in order to improve the following deficits and impairments:  Pain, Postural dysfunction, Impaired flexibility, Decreased strength, Decreased activity tolerance, Decreased range of motion,  Increased edema, Increased muscle spasms, Impaired UE functional use  Visit Diagnosis: Cervicalgia  Pain in left wrist     Problem List There are no problems to display for this patient.   [facility] [address] PT, DPT 08/19/2019, 8:47 AM  [facility] [address] [place], [place], [address] Phone: [phone]   Fax:  [phone]  Name: Saranya Harlin MRN: [id number] Date of Birth: 05/07/1980

## 2019-08-22 ENCOUNTER — Other Ambulatory Visit: Payer: Self-pay | Admitting: Family Medicine

## 2019-08-22 DIAGNOSIS — M25532 Pain in left wrist: Secondary | ICD-10-CM

## 2019-08-23 ENCOUNTER — Ambulatory Visit: Payer: Medicaid Other | Admitting: Physical Therapy

## 2019-08-23 ENCOUNTER — Encounter: Payer: Self-pay | Admitting: Physical Therapy

## 2019-08-23 ENCOUNTER — Other Ambulatory Visit: Payer: Self-pay

## 2019-08-23 DIAGNOSIS — M25532 Pain in left wrist: Secondary | ICD-10-CM

## 2019-08-23 DIAGNOSIS — M542 Cervicalgia: Secondary | ICD-10-CM | POA: Diagnosis not present

## 2019-08-23 NOTE — Therapy (Signed)
[place], [place], [address] Phone: [phone]   Fax:  [phone]  Physical Therapy Treatment  Patient Details  Name: Tammie Cook MRN: [id number] Date of Birth: 16-Sep-1980 Referring Provider (PT): Charlott Rakes, MD   Encounter Date: 08/23/2019   PT End of Session - 08/23/19 0836    Visit Number 6    Number of Visits 12    Date for PT Re-Evaluation 09/06/19    Authorization Type Medicaid-27 visit max    PT Start Time 1856    PT Stop Time 0833    PT Time Calculation (min) 38 min    Activity Tolerance Patient limited by pain   limited by pain for wrist   Behavior During Therapy [facility] for tasks assessed/performed           History reviewed. No pertinent past medical history.  Past Surgical History:  Procedure Laterality Date   CESAREAN SECTION      There were no vitals filed for this visit.   Subjective Assessment - 08/23/19 0756    Subjective Pt. has been referred by Dr. Margarita Rana to orthopedist re: continued wrist pain. Dr. Margarita Rana had also previously ordered X-ray for wrist but pt. had not been able to complete yet so was recommended per instructions from Dr. Margarita Rana to get X-ray. She arrives today noting some improvement in both regions with pain 3/10 but continues with difficulty wrist use and gripping activities. She reports ultrasound and ionto helpful for wrist pain.    Patient is accompained by: Interpreter   [phone]   Currently in Pain? Yes    Pain Score 3     Pain Location Wrist    Pain Orientation Left    Pain Descriptors / Indicators Throbbing    Pain Type Acute pain    Pain Onset More than a month ago    Pain Frequency Constant    Aggravating Factors  wrist use, gripping    Pain Relieving Factors cryo, medication    Effect of Pain on Daily Activities limits ability wrist use for ADLs, difficulty with gripping activities    Pain Score 3    Pain Location Neck    Pain  Orientation Right    Pain Type Acute pain    Pain Onset More than a month ago              [facility] PT Assessment - 08/23/19 0001      Strength   Overall Strength Comments left grip 8 lbs. with grip dynamonometer                         [facility] Adult PT Treatment/Exercise - 08/23/19 0001      Neck Exercises: Theraband   Shoulder Extension 15 reps;Red    Rows 15 reps;Red      Ultrasound   Ultrasound Location dorsal aspect of left wrist    Ultrasound Parameters 3 MHZ 50% 1.0 W/cm2 x 8 min    Ultrasound Goals Pain      Iontophoresis   Type of Iontophoresis Dexamethasone    Location radial aspect of dorsum of left wrist    Dose 80 mA/hr    Time 4 hr      Neck Exercises: Stretches   Upper Trapezius Stretch Right;3 reps;30 seconds    Levator Stretch Right;3 reps;30 seconds    Other Neck Stretches right SCM stretch 3x20 sec  PT Short Term Goals - 08/23/19 1700      PT SHORT TERM GOAL #1   Title Independent with initial HEP    Baseline instructed 07/19/19    Time 3    Period Weeks    Status Achieved      PT SHORT TERM GOAL #2   Title Increase right cervical rotation AROM at least 10 deg to improve ability to turn head while driving    Baseline 35 deg    Time 3    Period Weeks    Status Partially Met      PT SHORT TERM GOAL #3   Title Increase left grip strength to 15 lbs. or greater to improve ability for gripping, lifting activities for chores    Baseline 8 lbs.    Time 3    Period Weeks    Status On-going             PT Long Term Goals - 07/19/19 1540      PT LONG TERM GOAL #1   Title Increase bilateral cervical rotation AROM to at least 60 deg to improve ability to turn head while driving    Baseline right 35 deg, left 50 deg    Time 6    Period Weeks    Status New    Target Date 09/06/19      PT LONG TERM GOAL #2   Title Increase right shoulder strength at least grossly 1/2 MMT grade to improve ability for  lifting for chores, carrying groceries    Baseline 4/5    Time 6    Period Weeks    Status New    Target Date 09/06/19      PT LONG TERM GOAL #3   Title Independent with advanced HEP for continued progres after d/c from formal therapy    Time 6    Period Weeks    Status New    Target Date 09/06/19      PT LONG TERM GOAL #4   Title Perform ADLs/IADLs and chores with neck and left wrist pain consistently 3/10 or less    Baseline 7/10 for wrist, neck pain 4/10    Time 6    Period Weeks    Status New    Target Date 09/06/19                 Plan - 08/23/19 0837    Clinical Impression Statement For wrist mild improvement in pain level from last session and able to tolerate holdiong Theraband handles for light postural strengthening for neck today but pt. continues with limited tolerance wrist AROM/stretches and continues with grip weakness. Neck improving but still with right sided muscle tightness and myofascial discomfort.    Personal Factors and Comorbidities Time since onset of injury/illness/exacerbation    Examination-Activity Limitations Lift;Carry;Sleep    Examination-Participation Restrictions Cleaning;Driving    Stability/Clinical Decision Making Evolving/Moderate complexity    Clinical Decision Making Moderate    Rehab Potential Good    PT Frequency 2x / week    PT Duration 6 weeks    PT Treatment/Interventions ADLs/Self Care Home Management;Cryotherapy;Traction;Ultrasound;Electrical Stimulation;Iontophoresis 34m/ml Dexamethasone;Moist Heat;Therapeutic activities;Therapeutic exercise;Patient/family education;Manual techniques;Spinal Manipulations;Dry needling;Taping    PT Next Visit Plan Was pt. able to get X-rays for wrist/any word on orthopedic follow up? Continue ionto 1-2 more times and then hold, continue [place] found beneficial, continue exercises progression and manual as tolerated    PT Home Exercise Plan [id number]    Consulted and  Agree with Plan of Care  Patient           Patient will benefit from skilled therapeutic intervention in order to improve the following deficits and impairments:  Pain, Postural dysfunction, Impaired flexibility, Decreased strength, Decreased activity tolerance, Decreased range of motion, Increased edema, Increased muscle spasms, Impaired UE functional use  Visit Diagnosis: Cervicalgia  Pain in left wrist     Problem List There are no problems to display for this patient.   Beaulah Dinning, PT, DPT 08/23/19 8:43 AM  [facility] [address] [place], [place], [address] Phone: [phone]   Fax:  [phone]  Name: Tammie Cook MRN: [id number] Date of Birth: 03/16/1980

## 2019-08-26 ENCOUNTER — Ambulatory Visit: Payer: Medicaid Other | Admitting: Physical Therapy

## 2019-08-26 ENCOUNTER — Other Ambulatory Visit: Payer: Self-pay

## 2019-08-26 DIAGNOSIS — M25532 Pain in left wrist: Secondary | ICD-10-CM

## 2019-08-26 DIAGNOSIS — M542 Cervicalgia: Secondary | ICD-10-CM | POA: Diagnosis not present

## 2019-08-26 NOTE — Therapy (Signed)
[place], [place], [address] Phone: [phone]   Fax:  [phone]  Physical Therapy Treatment  Patient Details  Name: Tammie Cook MRN: [id number] Date of Birth: 1980/09/17 Referring Provider (PT): Charlott Rakes, MD   Encounter Date: 08/26/2019   PT End of Session - 08/26/19 0741    Visit Number 7    Number of Visits 12    Date for PT Re-Evaluation 09/06/19    Authorization Type Medicaid-27 visit max    PT Start Time 0745    PT Stop Time 0832    PT Time Calculation (min) 47 min    Activity Tolerance Patient limited by pain   limited by pain for wrist   Behavior During Therapy [facility] for tasks assessed/performed           No past medical history on file.  Past Surgical History:  Procedure Laterality Date  . CESAREAN SECTION      There were no vitals filed for this visit.   Subjective Assessment - 08/26/19 0745    Subjective Pt states she is to see the orthopedist on August 5 for x-rays. Wrist continues to remain painful with flexion. She reports no pain at rest.    Patient is accompained by: Interpreter   Imagene Gurney [phone]   Currently in Pain? Yes    Pain Score 6     Pain Location Wrist    Pain Orientation Left    Pain Descriptors / Indicators Throbbing    Pain Type Acute pain    Pain Onset More than a month ago    Multiple Pain Sites No    Pain Onset More than a month ago                             [facility] Adult PT Treatment/Exercise - 08/26/19 0001      Neck Exercises: Standing   Neck Retraction 10 reps    Neck Retraction Limitations iso    Other Standing Exercises side bend iso x10, flexion iso x10, extension iso x 10      Wrist Exercises   Wrist Flexion 10 reps;AROM   after [place]   Wrist Extension Strengthening;10 reps    Theraband Level (Wrist Extension) Level 1 (Yellow)    Other wrist exercises Gripping green putty x 10 with 3 sec hold    Other wrist  exercises finger extension against rubberband x 10, finger extensor stretch x 5 with 5 sec hold      Manual Therapy   Soft tissue mobilization Medial elbow to radial wrist    Myofascial Release medial elbow to radial wrist                    PT Short Term Goals - 08/23/19 6384      PT SHORT TERM GOAL #1   Title Independent with initial HEP    Baseline instructed 07/19/19    Time 3    Period Weeks    Status Achieved      PT SHORT TERM GOAL #2   Title Increase right cervical rotation AROM at least 10 deg to improve ability to turn head while driving    Baseline 35 deg    Time 3    Period Weeks    Status Partially Met      PT SHORT TERM GOAL #3   Title Increase left grip strength to 15 lbs. or greater  to improve ability for gripping, lifting activities for chores    Baseline 8 lbs.    Time 3    Period Weeks    Status On-going             PT Long Term Goals - 07/19/19 1540      PT LONG TERM GOAL #1   Title Increase bilateral cervical rotation AROM to at least 60 deg to improve ability to turn head while driving    Baseline right 35 deg, left 50 deg    Time 6    Period Weeks    Status New    Target Date 09/06/19      PT LONG TERM GOAL #2   Title Increase right shoulder strength at least grossly 1/2 MMT grade to improve ability for lifting for chores, carrying groceries    Baseline 4/5    Time 6    Period Weeks    Status New    Target Date 09/06/19      PT LONG TERM GOAL #3   Title Independent with advanced HEP for continued progres after d/c from formal therapy    Time 6    Period Weeks    Status New    Target Date 09/06/19      PT LONG TERM GOAL #4   Title Perform ADLs/IADLs and chores with neck and left wrist pain consistently 3/10 or less    Baseline 7/10 for wrist, neck pain 4/10    Time 6    Period Weeks    Status New    Target Date 09/06/19                 Plan - 08/26/19 0847    Clinical Impression Statement Wrist pain  continues with flexion. Provided manual therapy for tight forearm and wrist musculature as well as stretching. Increased pain noted with finger extension against resistance -- likely due to spasming extensors (provided stretch accordingly). Provided light strengthening exercises. Progressed pt's neck stabilization exercises. Continued improvements with neck. Pt to f/u with ortho.    Personal Factors and Comorbidities Time since onset of injury/illness/exacerbation    Examination-Activity Limitations Lift;Carry;Sleep    Examination-Participation Restrictions Cleaning;Driving    Stability/Clinical Decision Making Evolving/Moderate complexity    Rehab Potential Good    PT Frequency 2x / week    PT Duration 6 weeks    PT Treatment/Interventions ADLs/Self Care Home Management;Cryotherapy;Traction;Ultrasound;Electrical Stimulation;Iontophoresis 37m/ml Dexamethasone;Moist Heat;Therapeutic activities;Therapeutic exercise;Patient/family education;Manual techniques;Spinal Manipulations;Dry needling;Taping    PT Next Visit Plan Continue ionto 1-2 more times and then hold, continue [place] found beneficial, continue exercises progression and manual as tolerated.    PT Home Exercise Plan [id number]    Consulted and Agree with Plan of Care Patient           Patient will benefit from skilled therapeutic intervention in order to improve the following deficits and impairments:  Pain, Postural dysfunction, Impaired flexibility, Decreased strength, Decreased activity tolerance, Decreased range of motion, Increased edema, Increased muscle spasms, Impaired UE functional use  Visit Diagnosis: Cervicalgia  Pain in left wrist     Problem List There are no problems to display for this patient.   Dyan Creelman April Ma L Aline Wesche PT, DPT 08/26/2019, 11:51 AM  [facility]GBernice [place] [address]Phone: [phone]  Fax:  [phone] Name: Tammie AdesMRN: [id number] of Birth: [birth year]

## 2019-08-29 ENCOUNTER — Ambulatory Visit (HOSPITAL_COMMUNITY)
Admission: RE | Admit: 2019-08-29 | Discharge: 2019-08-29 | Disposition: A | Discharge status: Home / Self Care | Payer: Medicaid Other | Source: Ambulatory Visit | Attending: Family Medicine | Admitting: Family Medicine

## 2019-08-29 ENCOUNTER — Other Ambulatory Visit: Payer: Self-pay

## 2019-08-29 ENCOUNTER — Ambulatory Visit: Payer: Medicaid Other | Attending: Family Medicine

## 2019-08-29 DIAGNOSIS — M25532 Pain in left wrist: Secondary | ICD-10-CM | POA: Insufficient documentation

## 2019-08-29 DIAGNOSIS — M542 Cervicalgia: Secondary | ICD-10-CM | POA: Diagnosis present

## 2019-08-29 DIAGNOSIS — S6992XA Unspecified injury of left wrist, hand and finger(s), initial encounter: Secondary | ICD-10-CM | POA: Diagnosis not present

## 2019-08-29 NOTE — Therapy (Signed)
[facility] [address] [place], [place], [address] Phone: [phone]   Fax:  [phone]  Physical Therapy Treatment  Patient Details  Name: Tammie Cook MRN: [id number] Date of Birth: 25-Mar-1980 Referring Provider (PT): Hoy Register, MD   Encounter Date: 08/29/2019   PT End of Session - 08/29/19 0746    Visit Number 8    Number of Visits 12    Date for PT Re-Evaluation 09/06/19    Authorization Type Medicaid-27 visit max    Authorization - Visit Number 4    Authorization - Number of Visits 4    PT Start Time 0745    PT Stop Time 0830    PT Time Calculation (min) 45 min    Activity Tolerance Patient limited by pain    Behavior During Therapy [facility] for tasks assessed/performed           No past medical history on file.  Past Surgical History:  Procedure Laterality Date  . CESAREAN SECTION      There were no vitals filed for this visit.   Subjective Assessment - 08/29/19 0751    Subjective She reports pain and decreased use LT wrist and neck.   She reports it is better since starting PT. She feels  neck stretching  and wrist [place] and these feel good.    Patient is accompained by: Interpreter    Pain Score 4     Pain Location Wrist    Pain Orientation Left   dorsal aspect to elbow   Pain Descriptors / Indicators Throbbing    Pain Type Acute pain    Pain Onset More than a month ago    Pain Frequency Constant    Aggravating Factors  using wrist    Pain Relieving Factors ice meds    Pain Score 1   worse with turning to RT quick movement   Pain Location Neck    Pain Orientation Right    Pain Descriptors / Indicators Throbbing    Pain Type Acute pain    Pain Onset More than a month ago    Pain Frequency Constant                             [facility] Adult PT Treatment/Exercise - 08/29/19 0001      Wrist Exercises   Wrist Extension Left;10 reps    Wrist Extension Limitations green  Xtrainer    Other wrist exercises Gripping green putty x 10 with 5 sec hold    Other wrist exercises finger extension against rubberband x 10, finger extensor stretch x 5 with 5 sec hold      Ultrasound   Ultrasound Location dorsal aspect Lt wrist/forarm    Ultrasound Parameters 50% 1 Wcm2    Ultrasound Goals Pain      Iontophoresis   Type of Iontophoresis Dexamethasone    Location radial aspect of dorsum of left wrist    Dose 80 mA/hr    Time 4 hr      Manual Therapy   Soft tissue mobilization Medial elbow to radial wrist    Myofascial Release medial elbow to radial wrist      Neck Exercises: Stretches   Upper Trapezius Stretch Right;3 reps;30 seconds    Levator Stretch Right;3 reps;30 seconds    Other Neck Stretches right SCM stretch 3x20 sec  PT Short Term Goals - 08/29/19 3716      PT SHORT TERM GOAL #2   Title Increase right cervical rotation AROM at least 10 deg to improve ability to turn head while driving    Baseline 63 degrees bilaterally    Status Achieved      PT SHORT TERM GOAL #3   Title Increase left grip strength to 15 lbs. or greater to improve ability for gripping, lifting activities for chores    Status On-going             PT Long Term Goals - 07/19/19 1540      PT LONG TERM GOAL #1   Title Increase bilateral cervical rotation AROM to at least 60 deg to improve ability to turn head while driving    Baseline right 35 deg, left 50 deg    Time 6    Period Weeks    Status New    Target Date 09/06/19      PT LONG TERM GOAL #2   Title Increase right shoulder strength at least grossly 1/2 MMT grade to improve ability for lifting for chores, carrying groceries    Baseline 4/5    Time 6    Period Weeks    Status New    Target Date 09/06/19      PT LONG TERM GOAL #3   Title Independent with advanced HEP for continued progres after d/c from formal therapy    Time 6    Period Weeks    Status New    Target Date  09/06/19      PT LONG TERM GOAL #4   Title Perform ADLs/IADLs and chores with neck and left wrist pain consistently 3/10 or less    Baseline 7/10 for wrist, neck pain 4/10    Time 6    Period Weeks    Status New    Target Date 09/06/19                 Plan - 08/29/19 0746    Clinical Impression Statement Overall she is better with almost normal Neck rotation now with no significant pain iunless she turns quickly.  She is sore in LT forarm but significantly painful in a spot on dorsal wrist. She will see Ortho MD in near future . Asked her to call if he discontinues PT.    PT Treatment/Interventions ADLs/Self Care Home Management;Cryotherapy;Traction;Ultrasound;Electrical Stimulation;Iontophoresis 4mg /ml Dexamethasone;Moist Heat;Therapeutic activities;Therapeutic exercise;Patient/family education;Manual techniques;Spinal Manipulations;Dry needling;Taping    PT Next Visit Plan Continue ionto 1 more times and then hold, continue as found beneficial, continue exercises progression and manual as tolerated. follow up after MD appoint,ment    PT Home Exercise Plan [id number]    Consulted and Agree with Plan of Care Patient           Patient will benefit from skilled therapeutic intervention in order to improve the following deficits and impairments:  Pain, Postural dysfunction, Impaired flexibility, Decreased strength, Decreased activity tolerance, Decreased range of motion, Increased edema, Increased muscle spasms, Impaired UE functional use  Visit Diagnosis: Cervicalgia  Pain in left wrist     Problem List There are no problems to display for this patient.   [place]  PT 08/29/2019, 8:33 AM  [facility] [address] [place], [place], [place] Phone: [phone]   Fax:  [phone]  Name: Lenzi Marmo MRN: Elsie Amis Date of Birth: 11-15-1980

## 2019-08-30 ENCOUNTER — Encounter: Payer: Medicaid Other | Admitting: Physical Therapy

## 2019-09-01 ENCOUNTER — Other Ambulatory Visit: Payer: Self-pay

## 2019-09-01 ENCOUNTER — Encounter: Payer: Self-pay | Admitting: Orthopaedic Surgery

## 2019-09-01 ENCOUNTER — Ambulatory Visit: Payer: Medicaid Other | Admitting: Orthopaedic Surgery

## 2019-09-01 VITALS — Ht 64.0 in | Wt 180.0 lb

## 2019-09-01 DIAGNOSIS — M25532 Pain in left wrist: Secondary | ICD-10-CM | POA: Diagnosis not present

## 2019-09-01 MED ORDER — PREDNISONE 5 MG (21) PO TBPK: ORAL_TABLET | ORAL | 0 refills | Status: DC
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
## 2019-09-01 NOTE — Addendum Note (Signed)
Addended by: Albertina Parr on: 09/01/2019 03:35 PM   Modules accepted: Orders

## 2019-09-02 ENCOUNTER — Telehealth: Payer: Self-pay

## 2019-09-02 ENCOUNTER — Ambulatory Visit: Payer: Medicaid Other | Admitting: Physical Therapy

## 2019-09-02 NOTE — Telephone Encounter (Signed)
Patient was called and a voicemail was left informing patient to return phone call for lab results. 

## 2019-09-02 NOTE — Telephone Encounter (Signed)
-----   Message from Hoy Register, MD sent at 08/29/2019 11:46 AM EDT ----- Wrist x-rays negative.  I have referred her to a hand specialist for further evaluation given ongoing pain and would advise her to keep her appointment.

## 2019-09-06 ENCOUNTER — Encounter: Payer: Medicaid Other | Admitting: Physical Therapy

## 2019-09-09 ENCOUNTER — Encounter: Payer: Medicaid Other | Admitting: Physical Therapy

## 2019-09-13 ENCOUNTER — Telehealth: Payer: Self-pay | Admitting: Orthopaedic Surgery

## 2019-09-13 ENCOUNTER — Other Ambulatory Visit: Payer: Self-pay

## 2019-09-13 ENCOUNTER — Ambulatory Visit: Payer: Medicaid Other | Admitting: Physical Therapy

## 2019-09-13 ENCOUNTER — Encounter: Payer: Self-pay | Admitting: Physical Therapy

## 2019-09-13 DIAGNOSIS — M25532 Pain in left wrist: Secondary | ICD-10-CM

## 2019-09-13 DIAGNOSIS — M542 Cervicalgia: Secondary | ICD-10-CM | POA: Diagnosis not present
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
## 2019-09-13 NOTE — Telephone Encounter (Signed)
Called patient to schedule an appointment for MRI review with Dr Roda Shutters. Patient asked if I could have someone who speaks spanish to call her back. Patient said she didn't understand

## 2019-09-16 ENCOUNTER — Ambulatory Visit
Admission: RE | Admit: 2019-09-16 | Discharge: 2019-09-16 | Disposition: A | Discharge status: Home / Self Care | Payer: Medicaid Other | Source: Ambulatory Visit | Attending: Orthopaedic Surgery | Admitting: Orthopaedic Surgery

## 2019-09-16 ENCOUNTER — Ambulatory Visit: Payer: Medicaid Other | Admitting: Physical Therapy

## 2019-09-16 ENCOUNTER — Other Ambulatory Visit: Payer: Self-pay

## 2019-09-16 DIAGNOSIS — M542 Cervicalgia: Secondary | ICD-10-CM | POA: Diagnosis not present

## 2019-09-16 DIAGNOSIS — M25532 Pain in left wrist: Secondary | ICD-10-CM

## 2019-09-16 MED ORDER — IOPAMIDOL (ISOVUE-M 200) INJECTION 41%
2.0000 mL | Freq: Once | INTRAMUSCULAR | Status: AC
  Administered 2019-09-16: 2 mL via INTRA_ARTICULAR
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
## 2019-09-23 ENCOUNTER — Ambulatory Visit (INDEPENDENT_AMBULATORY_CARE_PROVIDER_SITE_OTHER): Payer: Medicaid Other | Admitting: Orthopaedic Surgery

## 2019-09-23 ENCOUNTER — Encounter: Payer: Self-pay | Admitting: Orthopaedic Surgery

## 2019-09-23 DIAGNOSIS — M25532 Pain in left wrist: Secondary | ICD-10-CM | POA: Diagnosis not present

## 2019-09-23 MED ORDER — MELOXICAM 7.5 MG PO TABS: 7.5000 mg | ORAL_TABLET | Freq: Two times a day (BID) | ORAL | 2 refills | Status: DC | PRN

## 2019-09-23 MED ORDER — PREDNISONE 10 MG (21) PO TBPK: ORAL_TABLET | ORAL | 0 refills | Status: DC
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
## 2019-10-13 NOTE — Therapy (Signed)
[place], [place], [address] Phone: [phone]   Fax:  [phone]  Physical Therapy Treatment/Discharge  Patient Details  Name: Tammie Cook MRN: [id number] Date of Birth: [birth year] Referring Provider (PT): Charlott Rakes, MD   Encounter Date: 09/16/2019    No past medical history on file.  Past Surgical History:  Procedure Laterality Date  . CESAREAN SECTION      There were no vitals filed for this visit.                                PT Short Term Goals - 09/13/19 0851      PT SHORT TERM GOAL #1   Title Independent with initial HEP    Baseline met    Time 3    Period Weeks    Status Achieved      PT SHORT TERM GOAL #2   Title Increase right cervical rotation AROM at least 10 deg to improve ability to turn head while driving    Baseline see objective    Time 3    Period Weeks    Status Achieved      PT SHORT TERM GOAL #3   Title Increase left grip strength to 15 lbs. or greater to improve ability for gripping, lifting activities for chores    Baseline 30 lbs. 09/13/19    Time 3    Period Weeks    Status Achieved             PT Long Term Goals - 09/13/19 5056      PT LONG TERM GOAL #1   Title Increase bilateral cervical rotation AROM to at least 60 deg to improve ability to turn head while driving    Baseline see objective    Time 6    Period Weeks    Status Achieved      PT LONG TERM GOAL #2   Title Increase right shoulder strength at least grossly 1/2 MMT grade to improve ability for lifting for chores, carrying groceries    Baseline 5/5    Time 6    Period Weeks    Status Achieved      PT LONG TERM GOAL #3   Title Independent with advanced HEP for continued progres after d/c from formal therapy    Baseline will continue to update prn    Time 6    Period Weeks    Status On-going    Target Date 10/25/19      PT LONG TERM  GOAL #4   Title Perform ADLs/IADLs and chores with neck and left wrist pain consistently 3/10 or less    Baseline Met for neck, wrist pain improving but goal ongoing for consistency of pain level    Time 6    Period Weeks    Status On-going    Target Date 10/25/19      PT LONG TERM GOAL #5   Title Left wrist strength 5/5 to improve ability for gripping and lifting activities for chores, IADLs and to assist return to work duties    Baseline 4+/5    Time 6    Period Weeks    Status New    Target Date 10/25/19                  Patient will benefit from skilled therapeutic intervention in order to improve the following deficits and  impairments:  Pain, Postural dysfunction, Impaired flexibility, Decreased strength, Decreased activity tolerance, Decreased range of motion, Increased edema, Increased muscle spasms, Impaired UE functional use  Visit Diagnosis: Pain in left wrist  Cervicalgia     Problem List There are no problems to display for this patient.    PHYSICAL THERAPY DISCHARGE SUMMARY  Visits from Start of Care: 10  Current functional level related to goals / functional outcomes: Patient had MRI for left wrist and had MD follow with current plans for referral for OT to address left wrist pain. No further PT visits planned at this time.  Remaining deficits: Left wrist pain   Education / Equipment: previous HEP instruction Plan:                                                    Patient goals were partially met.                                                                                                      ?????           Beaulah Dinning, PT, DPT 10/13/19 8:39 AM     [facility] [address] [place], [place], [address] Phone: [phone]   Fax:  [phone]  Name: Tammie Cook MRN: [id number] Date of Birth: 26-Sep-1980

## 2019-10-20 ENCOUNTER — Ambulatory Visit: Payer: Medicaid Other | Admitting: Occupational Therapy

## 2019-11-01 ENCOUNTER — Ambulatory Visit: Payer: Medicaid Other | Attending: Family Medicine | Admitting: Occupational Therapy

## 2019-11-01 ENCOUNTER — Other Ambulatory Visit: Payer: Self-pay

## 2019-11-01 ENCOUNTER — Encounter: Payer: Self-pay | Admitting: Occupational Therapy

## 2019-11-01 DIAGNOSIS — M25632 Stiffness of left wrist, not elsewhere classified: Secondary | ICD-10-CM | POA: Diagnosis present

## 2019-11-01 DIAGNOSIS — M6281 Muscle weakness (generalized): Secondary | ICD-10-CM | POA: Diagnosis present

## 2019-11-01 DIAGNOSIS — M25532 Pain in left wrist: Secondary | ICD-10-CM | POA: Insufficient documentation
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
## 2019-11-01 NOTE — Patient Instructions (Signed)
AROM: Wrist Extension   .  With _left___ palm down, bend wrist up. Repeat __15__ times per set.  Do __3__ sessions per day.    A

## 2019-11-17 ENCOUNTER — Ambulatory Visit: Payer: Medicaid Other | Admitting: Occupational Therapy

## 2019-11-22 ENCOUNTER — Encounter: Payer: Self-pay | Admitting: Occupational Therapy

## 2019-11-22 ENCOUNTER — Other Ambulatory Visit: Payer: Self-pay

## 2019-11-22 ENCOUNTER — Ambulatory Visit: Payer: Medicaid Other | Admitting: Occupational Therapy

## 2019-11-22 DIAGNOSIS — M25532 Pain in left wrist: Secondary | ICD-10-CM

## 2019-11-22 DIAGNOSIS — M6281 Muscle weakness (generalized): Secondary | ICD-10-CM

## 2019-11-22 DIAGNOSIS — M25632 Stiffness of left wrist, not elsewhere classified: Secondary | ICD-10-CM

## 2019-11-22 NOTE — Therapy (Signed)
[facility] [address] [place], [place], [address] Phone: [phone]   Fax:  [phone]  Occupational Therapy Treatment  Patient Details  Name: Tammie Cook MRN: [id number] Date of Birth: Aug 08, 1980 Referring Provider (OT): Dr. Roda Shutters   Encounter Date: 11/22/2019   OT End of Session - 11/22/19 1212    Visit Number 2    Number of Visits 11    Date for OT Re-Evaluation 12/13/19    Authorization Type [employer] medicaid    Authorization Time Period pt used 10 visits with PT previously    Authorization - Visit Number 2    Authorization - Number of Visits 10    OT Start Time [phone]    OT Stop Time 0805    OT Time Calculation (min) 47 min    Activity Tolerance Patient tolerated treatment well    Behavior During Therapy [facility] for tasks assessed/performed           History reviewed. No pertinent past medical history.  Past Surgical History:  Procedure Laterality Date  . CESAREAN SECTION      There were no vitals filed for this visit.   Subjective Assessment - 11/22/19 0723    Subjective  Pt reports sometimes she has sharp pain during the daytime    Currently in Pain? Yes    Pain Score 3     Pain Location Wrist    Pain Orientation Left    Pain Descriptors / Indicators Aching    Pain Onset More than a month ago    Pain Frequency Intermittent    Aggravating Factors  use    Pain Relieving Factors brace                    Treatment: Fluidotherapy x 11 mins to LUE for pain and stiffness. [place] , 0.8 w/cm 2, 20% x 8 mins, to dorsal wrist  no adverse reactions A/ROM HEP issued, see pt instructions. Attempted isometric wrist extension, however pt reported pain after several reps. Ice pack applied end of session x 8 mins for pain relief, no adverse reactions.            OT Education - 11/22/19 0759    Education Details A/ROM HEP    Person(s) Educated Patient    Methods  Explanation;Demonstration;Verbal cues;Handout    Comprehension Verbalized understanding;Returned demonstration;Verbal cues required               OT Long Term Goals - 11/01/19 0902      OT LONG TERM GOAL #1   Title I with HEP    Baseline dependent    Time 5    Period Weeks    Status New    Target Date 12/13/19      OT LONG TERM GOAL #2   Title I with activity modification, splint wear and pain reduction strategies    Baseline needs reinforcement    Time 5    Period Weeks    Status New      OT LONG TERM GOAL #3   Title Pt will demonstrate L wrist flexion/ extension WFLS for ADLS/IADLS    Baseline Wrist flexion/ extension 50/ 45    Time 5    Period Weeks    Status New      OT LONG TERM GOAL #4   Title Pt will increase LUE grip strength by 5 lbs for increased functional use during ADLs.    Baseline RUE 46 lbs, LUE 26.2  lbs    Time 5    Period Weeks    Status New      OT LONG TERM GOAL #5   Title Pt will report she has resumed use of LUE as a non dominant assist for ADLS/IADLS at least 90% of the time with pain less than or equal to 2/10.    Baseline Pain in 3/10 or greater with use    Time 5    Period Weeks    Status New                 Plan - 11/22/19 0759    Clinical Impression Statement Pt returns for her first visit since eval. She continues to report significant pain at time. Formal A/ROM HEP issued.    OT Occupational Profile and History Problem Focused Assessment - Including review of records relating to presenting problem    Occupational performance deficits (Please refer to evaluation for details): ADL's;IADL's;Rest and Sleep;Leisure;Work;Social Participation    Body Structure / Function / Physical Skills ADL;UE functional use;Flexibility;Pain;FMC;ROM;Coordination;GMC;Decreased knowledge of precautions;Sensation;Decreased knowledge of use of DME;IADL;Dexterity;Strength;Edema    Rehab Potential Good    Clinical Decision Making Limited treatment  options, no task modification necessary    Comorbidities Affecting Occupational Performance: None    Modification or Assistance to Complete Evaluation  No modification of tasks or assist necessary to complete eval    OT Frequency 2x / week    OT Duration --   5 weeks   OT Treatment/Interventions Self-care/ADL training;Ultrasound;Energy conservation;Patient/family education;DME and/or AE instruction;Iontophoresis;Paraffin;Passive range of motion;Cryotherapy;Fluidtherapy;Splinting;Contrast Bath;Electrical Stimulation;Moist Heat;Therapeutic exercise;Manual Therapy;Therapeutic activities;Neuromuscular education    Plan progress HEP, issued A/ROM wrist flexion/ extension, consider isometrics when pain is better, fluido/US    Consulted and Agree with Plan of Care Patient;Other (Comment)   interpreter via stratus          Patient will benefit from skilled therapeutic intervention in order to improve the following deficits and impairments:   Body Structure / Function / Physical Skills: ADL, UE functional use, Flexibility, Pain, FMC, ROM, Coordination, GMC, Decreased knowledge of precautions, Sensation, Decreased knowledge of use of DME, IADL, Dexterity, Strength, Edema       Visit Diagnosis: Pain in left wrist  Stiffness of left wrist, not elsewhere classified  Muscle weakness (generalized)    Problem List There are no problems to display for this patient.   Anaise Sterbenz 11/22/2019, 12:13 PM  [facility] [address] [place], [place], [address] Phone: [phone]   Fax:  [phone]  Name: Tammie Cook MRN: [id number] Date of Birth: Mar 25, 1980

## 2019-11-22 NOTE — Patient Instructions (Signed)
AROM: Wrist Extension   .  With __left__ palm down, bend wrist up. Repeat __15__ times per set.  Do __3_ sessions per day.    AROM: Wrist Flexion   With__left___ palm up, bend wrist up. Repeat __15__ times per set.  Do _3___ sessions per day.   AROM: Forearm Pronation / Supination   With _left___ arm in handshake position, slowly rotate palm down until stretch is felt. Relax. Then rotate palm up until stretch is felt. Repeat _15___ times per set. Do _3

## 2019-11-24 ENCOUNTER — Encounter: Payer: Self-pay | Admitting: Occupational Therapy

## 2019-11-24 ENCOUNTER — Ambulatory Visit: Payer: Medicaid Other | Admitting: Occupational Therapy

## 2019-11-24 ENCOUNTER — Other Ambulatory Visit: Payer: Self-pay

## 2019-11-24 DIAGNOSIS — M25532 Pain in left wrist: Secondary | ICD-10-CM

## 2019-11-24 DIAGNOSIS — M25632 Stiffness of left wrist, not elsewhere classified: Secondary | ICD-10-CM

## 2019-11-24 DIAGNOSIS — M6281 Muscle weakness (generalized): Secondary | ICD-10-CM

## 2019-11-24 NOTE — Therapy (Signed)
[facility] [address] [place], [place], [address] Phone: [phone]   Fax:  [phone]  Occupational Therapy Treatment  Patient Details  Name: Tammie Cook MRN: [id number] Date of Birth: 05-Feb-1980 Referring Provider (OT): Dr. Roda Shutters   Encounter Date: 11/24/2019   OT End of Session - 11/24/19 0732    Visit Number 3    Number of Visits 11    Date for OT Re-Evaluation 12/13/19    Authorization Type [employer] medicaid, no auth required    Authorization Time Period pt used 10 visits with PT previously    Authorization - Visit Number 3    Authorization - Number of Visits 10    OT Start Time 0720    OT Stop Time 0800    OT Time Calculation (min) 40 min    Activity Tolerance Patient tolerated treatment well    Behavior During Therapy [facility] for tasks assessed/performed           History reviewed. No pertinent past medical history.  Past Surgical History:  Procedure Laterality Date  . CESAREAN SECTION      There were no vitals filed for this visit.   Subjective Assessment - 11/24/19 0732    Subjective  little pain this morning    Patient is accompanied by: Interpreter   via video   Currently in Pain? Yes    Pain Score 1     Pain Location Wrist    Pain Orientation Left    Pain Descriptors / Indicators Aching    Pain Onset More than a month ago    Pain Frequency Intermittent    Aggravating Factors  use    Pain Relieving Factors brace               Fluidotherapy x 10 mins to LUE for pain and stiffness with no adverse reactions.  Ultrasound , 0.8 w/cm 2, 20% x 8 mins, to dorsal wrist  no adverse reactions  Reviewed A/ROM HEP (wrist flex, ext, supination/pronation) and pt returned demo each x15.  UD/RD AROM x15   Pt instructed to wear wrist brace at night to improve positioning due to reports of pain waking her up at night.  Pt can remove for exercises and during rest, but should wear during  pushing, pulling, lifting, gripping/pinching tasks.  Pt verbalized understanding.  Soft tissue mobs to L volar forearm as pt reports point tender here and at medial epicondyle       OT Education - 11/24/19 0915    Education Details Massage to left volar forarm for muscle tightness x5 min x3/day (circles both directions, across muscles and along length of muscle)    Person(s) Educated Patient    Methods Explanation;Demonstration;Verbal cues    Comprehension Verbalized understanding               OT Long Term Goals - 11/01/19 0902      OT LONG TERM GOAL #1   Title I with HEP    Baseline dependent    Time 5    Period Weeks    Status New    Target Date 12/13/19      OT LONG TERM GOAL #2   Title I with activity modification, splint wear and pain reduction strategies    Baseline needs reinforcement    Time 5    Period Weeks    Status New      OT LONG TERM GOAL #3   Title Pt will demonstrate L  wrist flexion/ extension WFLS for ADLS/IADLS    Baseline Wrist flexion/ extension 50/ 45    Time 5    Period Weeks    Status New      OT LONG TERM GOAL #4   Title Pt will increase LUE grip strength by 5 lbs for increased functional use during ADLs.    Baseline RUE 46 lbs, LUE 26.2 lbs    Time 5    Period Weeks    Status New      OT LONG TERM GOAL #5   Title Pt will report she has resumed use of LUE as a non dominant assist for ADLS/IADLS at least 90% of the time with pain less than or equal to 2/10.    Baseline Pain in 3/10 or greater with use    Time 5    Period Weeks    Status New                 Plan - 11/24/19 0175    Clinical Impression Statement Pt with only mild pain during session, but is point tender to touch at medial epicondyle and volar forearm.  Instructed pt in soft tissue massage to volar forearm and pt verbalized understanding.    OT Occupational Profile and History Problem Focused Assessment - Including review of records relating to presenting  problem    Occupational performance deficits (Please refer to evaluation for details): ADL's;IADL's;Rest and Sleep;Leisure;Work;Social Participation    Body Structure / Function / Physical Skills ADL;UE functional use;Flexibility;Pain;FMC;ROM;Coordination;GMC;Decreased knowledge of precautions;Sensation;Decreased knowledge of use of DME;IADL;Dexterity;Strength;Edema    Rehab Potential Good    Clinical Decision Making Limited treatment options, no task modification necessary    Comorbidities Affecting Occupational Performance: None    Modification or Assistance to Complete Evaluation  No modification of tasks or assist necessary to complete eval    OT Frequency 2x / week    OT Duration --   5 weeks   OT Treatment/Interventions Self-care/ADL training;Ultrasound;Energy conservation;Patient/family education;DME and/or AE instruction;Iontophoresis;Paraffin;Passive range of motion;Cryotherapy;Fluidtherapy;Splinting;Contrast Bath;Electrical Stimulation;Moist Heat;Therapeutic exercise;Manual Therapy;Therapeutic activities;Neuromuscular education    Plan progress HEP consider PROM wrist flex/ext and isometrics when pain is better, fluido/Ultrasound, soft tissue mobs prn    Consulted and Agree with Plan of Care Patient;Other (Comment)   interpreter via stratus          Patient will benefit from skilled therapeutic intervention in order to improve the following deficits and impairments:   Body Structure / Function / Physical Skills: ADL, UE functional use, Flexibility, Pain, FMC, ROM, Coordination, GMC, Decreased knowledge of precautions, Sensation, Decreased knowledge of use of DME, IADL, Dexterity, Strength, Edema       Visit Diagnosis: Pain in left wrist  Stiffness of left wrist, not elsewhere classified  Muscle weakness (generalized)    Problem List There are no problems to display for this patient.   [facility] 11/24/2019, 2:31 PM  [facility] [address] [place], [place], [address] Phone: [phone]   Fax:  [phone]  Name: Captola Teschner MRN: [id number] Date of Birth: 1980/02/01   Willa Frater, OTR/L [facility] [address] [place], [place]  [address] [phone] phone [phone] 11/24/19 2:31 PM

## 2019-11-29 ENCOUNTER — Other Ambulatory Visit: Payer: Self-pay

## 2019-11-29 ENCOUNTER — Ambulatory Visit: Payer: Medicaid Other | Attending: Family Medicine | Admitting: Occupational Therapy

## 2019-11-29 DIAGNOSIS — M6281 Muscle weakness (generalized): Secondary | ICD-10-CM | POA: Diagnosis present

## 2019-11-29 DIAGNOSIS — M25532 Pain in left wrist: Secondary | ICD-10-CM | POA: Diagnosis present

## 2019-11-29 DIAGNOSIS — M25632 Stiffness of left wrist, not elsewhere classified: Secondary | ICD-10-CM | POA: Diagnosis present

## 2019-11-29 NOTE — Patient Instructions (Signed)
PROM: Wrist Flexion / Extension   Grasp  hand and slowly bend wrist until stretch is felt. Relax. Then stretch as far as possible in opposite direction. Be sure to keep elbow bent.  Hold __10__ sec. each way Repeat  10___ times per set.    Do _2___ sessions per day.    Extension (Isometric)    Mantenga el antebrazo fijo con la palma hacia abajo. Resista el movimiento hacia arriba de su mano con la otra. Mantenga ___5_ segundos. Relaje. Repita _5___ veces. Haga __2__ sesiones por da.  Copyright  V[employer]. All rights reserved.

## 2019-11-29 NOTE — Therapy (Signed)
[facility] [address] [place], [place], [address] Phone: [phone]   Fax:  [phone]  Occupational Therapy Treatment  Patient Details  Name: Khaleelah Yowell MRN: [id number] Date of Birth: 10/31/1980 Referring Provider (OT): Dr. Roda Shutters   Encounter Date: 11/29/2019   OT End of Session - 11/29/19 0726    Visit Number 4    Number of Visits 11    Date for OT Re-Evaluation 12/13/19    Authorization Type UHC medicaid, no auth required    Authorization Time Period pt used 10 visits with PT previously    Authorization - Visit Number 4    Authorization - Number of Visits 10    OT Start Time [phone]    OT Stop Time 0758    OT Time Calculation (min) 40 min    Activity Tolerance Patient tolerated treatment well    Behavior During Therapy [facility] for tasks assessed/performed           No past medical history on file.  Past Surgical History:  Procedure Laterality Date   CESAREAN SECTION      There were no vitals filed for this visit.   Subjective Assessment - 11/29/19 0725    Subjective  Pt reports pain 1/10, she reports sleeping in brace    Currently in Pain? Yes    Pain Score 1     Pain Location Wrist    Pain Orientation Left    Pain Descriptors / Indicators Aching    Pain Type Chronic pain    Pain Radiating Towards elbow    Pain Onset More than a month ago    Pain Frequency Intermittent    Aggravating Factors  use    Pain Relieving Factors brace    Effect of Pain on Daily Activities limits functional use                   Fluidotherapy x 10 mins to LUE for pain and stiffness with no adverse reactions.  Ultrasound , 0.8 w/cm 2, 20% x 8 mins, to dorsal wrist  no adverse reactions with hotpack applied to elbow simultaneously for pain, no adverse reactions.  Reviewed A/ROM HEP (wrist flex, ext, ulnar/ radial deviation) and pt returned demo each x15.  Added gentle wrist P/ROM flexion/  extension, and isometrics for wrist extension, see pt instructions  Soft tissue mobs to L volar forearm, dorsal forearm and medial elbow, no adverse reactions.               OT Education - 11/29/19 0902    Education Details Massage to left volar and dorsal  forarm/ medial  elbow for muscle tightness, gentle wrist P/ROM, isometrics    Person(s) Educated Patient    Methods Explanation;Demonstration;Verbal cues;Handout    Comprehension Verbalized understanding;Returned demonstration;Verbal cues required               OT Long Term Goals - 11/01/19 0902      OT LONG TERM GOAL #1   Title I with HEP    Baseline dependent    Time 5    Period Weeks    Status New    Target Date 12/13/19      OT LONG TERM GOAL #2   Title I with activity modification, splint wear and pain reduction strategies    Baseline needs reinforcement    Time 5    Period Weeks    Status New      OT LONG TERM GOAL #  3   Title Pt will demonstrate L wrist flexion/ extension WFLS for ADLS/IADLS    Baseline Wrist flexion/ extension 50/ 45    Time 5    Period Weeks    Status New      OT LONG TERM GOAL #4   Title Pt will increase LUE grip strength by 5 lbs for increased functional use during ADLs.    Baseline RUE 46 lbs, LUE 26.2 lbs    Time 5    Period Weeks    Status New      OT LONG TERM GOAL #5   Title Pt will report she has resumed use of LUE as a non dominant assist for ADLS/IADLS at least 90% of the time with pain less than or equal to 2/10.    Baseline Pain in 3/10 or greater with use    Time 5    Period Weeks    Status New                 Plan - 11/29/19 0727    Clinical Impression Statement Pt demonstrates overall improvement in pain. she reports wearing brace at night.    OT Occupational Profile and History Problem Focused Assessment - Including review of records relating to presenting problem    Occupational performance deficits (Please refer to evaluation for details):  ADL's;IADL's;Rest and Sleep;Leisure;Work;Social Participation    Body Structure / Function / Physical Skills ADL;UE functional use;Flexibility;Pain;FMC;ROM;Coordination;GMC;Decreased knowledge of precautions;Sensation;Decreased knowledge of use of DME;IADL;Dexterity;Strength;Edema    Rehab Potential Good    Clinical Decision Making Limited treatment options, no task modification necessary    Comorbidities Affecting Occupational Performance: None    Modification or Assistance to Complete Evaluation  No modification of tasks or assist necessary to complete eval    OT Frequency 2x / week    OT Duration --   5 weeks   OT Treatment/Interventions Self-care/ADL training;Ultrasound;Energy conservation;Patient/family education;DME and/or AE instruction;Iontophoresis;Paraffin;Passive range of motion;Cryotherapy;Fluidtherapy;Splinting;Contrast Bath;Electrical Stimulation;Moist Heat;Therapeutic exercise;Manual Therapy;Therapeutic activities;Neuromuscular education    Plan PROM wrist flex/ext and isometrics  reveiw, fluido/Ultrasound, soft tissue mobs prn    Consulted and Agree with Plan of Care Patient;Other (Comment)   interpreter via stratus          Patient will benefit from skilled therapeutic intervention in order to improve the following deficits and impairments:   Body Structure / Function / Physical Skills: ADL, UE functional use, Flexibility, Pain, FMC, ROM, Coordination, GMC, Decreased knowledge of precautions, Sensation, Decreased knowledge of use of DME, IADL, Dexterity, Strength, Edema       Visit Diagnosis: Pain in left wrist  Stiffness of left wrist, not elsewhere classified  Muscle weakness (generalized)    Problem List There are no problems to display for this patient.   Maryhelen Lindler 11/29/2019, 9:03 AM  [facility] [address] [place], [place], [address] Phone: [phone]   Fax:  [phone]  Name:  Athalia Setterlund MRN: [id number] Date of Birth: March 11, 1980

## 2019-12-01 ENCOUNTER — Ambulatory Visit: Payer: Medicaid Other | Admitting: Occupational Therapy

## 2019-12-01 ENCOUNTER — Other Ambulatory Visit: Payer: Self-pay

## 2019-12-01 ENCOUNTER — Encounter: Payer: Self-pay | Admitting: Occupational Therapy

## 2019-12-01 DIAGNOSIS — M25532 Pain in left wrist: Secondary | ICD-10-CM

## 2019-12-01 DIAGNOSIS — M25632 Stiffness of left wrist, not elsewhere classified: Secondary | ICD-10-CM

## 2019-12-01 DIAGNOSIS — M6281 Muscle weakness (generalized): Secondary | ICD-10-CM

## 2019-12-01 NOTE — Therapy (Signed)
[facility] [address] [place], [place], [address] Phone: [phone]   Fax:  [phone]  Occupational Therapy Treatment  Patient Details  Name: Tammie Cook MRN: [id number] Date of Birth: 08-Apr-1980 Referring Provider (OT): Dr. Roda Shutters   Encounter Date: 12/01/2019   OT End of Session - 12/01/19 0728    Visit Number 5    Number of Visits 11    Date for OT Re-Evaluation 12/13/19    Authorization Type UHC medicaid, no auth required    Authorization Time Period pt used 10 visits with PT previously, 27 visit limit    Authorization - Visit Number 5    Authorization - Number of Visits 10    OT Start Time [phone]    OT Stop Time 0800    OT Time Calculation (min) 39 min    Activity Tolerance Patient tolerated treatment well    Behavior During Therapy [facility] for tasks assessed/performed           History reviewed. No pertinent past medical history.  Past Surgical History:  Procedure Laterality Date   CESAREAN SECTION      There were no vitals filed for this visit.   Subjective Assessment - 12/01/19 0727    Subjective  hurt a lot yesterday (4-5/10) with cooking    Patient is accompanied by: Interpreter   via video; however, connection lost multiple times, but pt reports ok to proceed as she understands some [place].   Currently in Pain? Yes    Pain Score 2     Pain Location Hand    Pain Orientation Left    Pain Descriptors / Indicators Aching    Pain Type Chronic pain    Pain Radiating Towards elbow    Pain Onset More than a month ago    Pain Frequency Intermittent    Aggravating Factors  use    Pain Relieving Factors brace                Fluidotherapy x 10 mins to LUE for pain and stiffness with no adverse reactions.  Reviewed A/ROM HEP (wrist flex, ext, ulnar/ radial deviation) and pt returned demo each x15.  Ultrasound , 0.8 w/cm 2, 20% x 8 mins, to dorsal wrist  no adverse reactions    Reviewed gentle wrist P/ROM flexion/extension, and isometrics for wrist extension and pt returned demo   Ice massage x27min to L dorsal wrist with no adverse reactions for pain/edema           OT Long Term Goals - 11/01/19 0902      OT LONG TERM GOAL #1   Title I with HEP    Baseline dependent    Time 5    Period Weeks    Status New    Target Date 12/13/19      OT LONG TERM GOAL #2   Title I with activity modification, splint wear and pain reduction strategies    Baseline needs reinforcement    Time 5    Period Weeks    Status New      OT LONG TERM GOAL #3   Title Pt will demonstrate L wrist flexion/ extension WFLS for ADLS/IADLS    Baseline Wrist flexion/ extension 50/ 45    Time 5    Period Weeks    Status New      OT LONG TERM GOAL #4   Title Pt will increase LUE grip strength by 5 lbs for increased functional use  during ADLs.    Baseline RUE 46 lbs, LUE 26.2 lbs    Time 5    Period Weeks    Status New      OT LONG TERM GOAL #5   Title Pt will report she has resumed use of LUE as a non dominant assist for ADLS/IADLS at least 90% of the time with pain less than or equal to 2/10.    Baseline Pain in 3/10 or greater with use    Time 5    Period Weeks    Status New                 Plan - 12/01/19 0729    Clinical Impression Statement Pt reports that pain continues to fluctuate with use.  No pain at end of the session.    OT Occupational Profile and History Problem Focused Assessment - Including review of records relating to presenting problem    Occupational performance deficits (Please refer to evaluation for details): ADL's;IADL's;Rest and Sleep;Leisure;Work;Social Participation    Body Structure / Function / Physical Skills ADL;UE functional use;Flexibility;Pain;FMC;ROM;Coordination;GMC;Decreased knowledge of precautions;Sensation;Decreased knowledge of use of DME;IADL;Dexterity;Strength;Edema    Rehab Potential Good    Clinical Decision Making  Limited treatment options, no task modification necessary    Comorbidities Affecting Occupational Performance: None    Modification or Assistance to Complete Evaluation  No modification of tasks or assist necessary to complete eval    OT Frequency 2x / week    OT Duration --   5 weeks   OT Treatment/Interventions Self-care/ADL training;Ultrasound;Energy conservation;Patient/family education;DME and/or AE instruction;Iontophoresis;Paraffin;Passive range of motion;Cryotherapy;Fluidtherapy;Splinting;Contrast Bath;Electrical Stimulation;Moist Heat;Therapeutic exercise;Manual Therapy;Therapeutic activities;Neuromuscular education    Plan continue with active and PROM, isometrics, fludio/ultrasound, soft tissue mobs prn, progress HEP as able dependent on pain    Consulted and Agree with Plan of Care Patient;Other (Comment)   interpreter via stratus          Patient will benefit from skilled therapeutic intervention in order to improve the following deficits and impairments:   Body Structure / Function / Physical Skills: ADL, UE functional use, Flexibility, Pain, FMC, ROM, Coordination, GMC, Decreased knowledge of precautions, Sensation, Decreased knowledge of use of DME, IADL, Dexterity, Strength, Edema       Visit Diagnosis: Pain in left wrist  Stiffness of left wrist, not elsewhere classified  Muscle weakness (generalized)    Problem List There are no problems to display for this patient.   [facility] 12/01/2019, 9:58 AM  [facility] [address] [place], [place], [address] Phone: [phone]   Fax:  [phone]  Name: Tammie Cook MRN: [id number] Date of Birth: [birth year]   Willa Frater, OTR/L [facility] [address] [place], [place]  [address] [phone] phone [phone] 12/01/19 9:58 AM

## 2019-12-06 ENCOUNTER — Ambulatory Visit: Payer: Medicaid Other | Admitting: Occupational Therapy

## 2019-12-06 ENCOUNTER — Other Ambulatory Visit: Payer: Self-pay

## 2019-12-06 DIAGNOSIS — M6281 Muscle weakness (generalized): Secondary | ICD-10-CM

## 2019-12-06 DIAGNOSIS — M25632 Stiffness of left wrist, not elsewhere classified: Secondary | ICD-10-CM

## 2019-12-06 DIAGNOSIS — M25532 Pain in left wrist: Secondary | ICD-10-CM | POA: Diagnosis not present

## 2019-12-06 NOTE — Therapy (Signed)
[facility] [address] [place], [place], [address] Phone: [phone]   Fax:  [phone]  Occupational Therapy Treatment  Patient Details  Name: Tammie Cook MRN: [id number] Date of Birth: 1980/11/17 Referring Provider (OT): Dr. Roda Shutters   Encounter Date: 12/06/2019   OT End of Session - 12/06/19 0744    Visit Number 6    Number of Visits 11    Date for OT Re-Evaluation 12/13/19    Authorization Type UHC medicaid, no auth required    Authorization Time Period pt used 10 visits with PT previously, 27 visit limit    Authorization - Visit Number 6    Authorization - Number of Visits 10    OT Start Time [phone]    OT Stop Time 0756    OT Time Calculation (min) 40 min    Activity Tolerance Patient tolerated treatment well    Behavior During Therapy [facility] for tasks assessed/performed           No past medical history on file.  Past Surgical History:  Procedure Laterality Date  . CESAREAN SECTION      There were no vitals filed for this visit.   Subjective Assessment - 12/06/19 0751    Subjective  Pt reports increased pain with cold    Currently in Pain? Yes    Pain Score 3     Pain Location Wrist    Pain Orientation Left    Pain Descriptors / Indicators Aching    Pain Type Chronic pain    Pain Onset More than a month ago    Pain Frequency Constant    Aggravating Factors  use    Pain Relieving Factors brace, heat                     Fluidotherapy x 10 mins to LUE for pain and stiffness with no adverse reactions.  Reviewed A/ROM HEP (wrist flex, ext, ulnar/ radial deviation) and pt returned demo each x15.  Ultrasound , 0.8 w/cm 2, 20% x 8 mins, to dorsal wrist  And lateral forearm, no adverse reactions   Reviewed gentle wrist P/ROM flexion/extension, and isometrics for wrist extension and pt returned demo , added gentle strengthening with a 1 lbs weight for supination/ pronation,  ulnar/ radial deviation, and wrist flexion/ extension, 10 reps each, min v.c- pt returned demonstration.   Ice pack x 7 mins to left wrist, end of session for edema, no adverse reactions.             OT Education - 12/06/19 1141    Education Details srengthening with 1 lbs weight for wrist flex/ ext, supination/ pronation, ulnar/ readial deviation.    Person(s) Educated Patient    Methods Explanation;Demonstration;Verbal cues;Handout    Comprehension Verbalized understanding;Returned demonstration;Verbal cues required               OT Long Term Goals - 11/01/19 0902      OT LONG TERM GOAL #1   Title I with HEP    Baseline dependent    Time 5    Period Weeks    Status New    Target Date 12/13/19      OT LONG TERM GOAL #2   Title I with activity modification, splint wear and pain reduction strategies    Baseline needs reinforcement    Time 5    Period Weeks    Status New      OT LONG TERM GOAL #  3   Title Pt will demonstrate L wrist flexion/ extension WFLS for ADLS/IADLS    Baseline Wrist flexion/ extension 50/ 45    Time 5    Period Weeks    Status New      OT LONG TERM GOAL #4   Title Pt will increase LUE grip strength by 5 lbs for increased functional use during ADLs.    Baseline RUE 46 lbs, LUE 26.2 lbs    Time 5    Period Weeks    Status New      OT LONG TERM GOAL #5   Title Pt will report she has resumed use of LUE as a non dominant assist for ADLS/IADLS at least 90% of the time with pain less than or equal to 2/10.    Baseline Pain in 3/10 or greater with use    Time 5    Period Weeks    Status New                 Plan - 12/06/19 1140    Clinical Impression Statement Pt is progressing towards goals. Pt's pain continues to decrease overall.    OT Occupational Profile and History Problem Focused Assessment - Including review of records relating to presenting problem    Occupational performance deficits (Please refer to evaluation for  details): ADL's;IADL's;Rest and Sleep;Leisure;Work;Social Participation    Body Structure / Function / Physical Skills ADL;UE functional use;Flexibility;Pain;FMC;ROM;Coordination;GMC;Decreased knowledge of precautions;Sensation;Decreased knowledge of use of DME;IADL;Dexterity;Strength;Edema    Rehab Potential Good    Clinical Decision Making Limited treatment options, no task modification necessary    Comorbidities Affecting Occupational Performance: None    Modification or Assistance to Complete Evaluation  No modification of tasks or assist necessary to complete eval    OT Frequency 2x / week    OT Duration --   5 weeks   OT Treatment/Interventions Self-care/ADL training;Ultrasound;Energy conservation;Patient/family education;DME and/or AE instruction;Iontophoresis;Paraffin;Passive range of motion;Cryotherapy;Fluidtherapy;Splinting;Contrast Bath;Electrical Stimulation;Moist Heat;Therapeutic exercise;Manual Therapy;Therapeutic activities;Neuromuscular education    Plan continue with A/ROM, P/ROM and gentle strengthening with a 1 lbs weight, consider putty as pain decreases.    Consulted and Agree with Plan of Care Patient;Other (Comment)   interpreter via stratus          Patient will benefit from skilled therapeutic intervention in order to improve the following deficits and impairments:   Body Structure / Function / Physical Skills: ADL, UE functional use, Flexibility, Pain, FMC, ROM, Coordination, GMC, Decreased knowledge of precautions, Sensation, Decreased knowledge of use of DME, IADL, Dexterity, Strength, Edema       Visit Diagnosis: Pain in left wrist  Stiffness of left wrist, not elsewhere classified  Muscle weakness (generalized)    Problem List There are no problems to display for this patient.   Simi Briel 12/06/2019, 11:43 AM  [facility] [address] [place], [place], [address] Phone: [phone]    Fax:  [phone]  Name: Tammie Cook MRN: [id number] Date of Birth: 1980/12/24

## 2019-12-06 NOTE — Patient Instructions (Signed)
Wrist Extension: Resisted    Con la palma derecha Phoebe Sharps, y un a pesa de ____ [place] mano, doble la [place]. Regrese lento. Repita _1__ veces por rutina. Realice __10__ Carlis Stable por sesin. Realice ___1_ sesiones por da.  Copyright  VHI. All rights reserved. AROM: Wrist Radial / Ulnar Deviation Against Gravity    Con el pulgar apuntando hacia usted, doble suavemente la [place] hacia el cuerpo, [place] fuera y [place]. Mantenga el codo flexionado y apoyado. Repita __1_ veces por rutina. Realice __10__ Carlis Stable por sesin. Realice __1__ sesiones por da. Supination / Pronation    Agarre una lata con la mano afectada y rtela de lado a lado, girando la palma hacia arriba y [place]. Mantenga el codo al [employer]. Repita _10___ veces. Haga __1__ sesiones por da.  [email or web address].[email or web address]   Copyright  VHI. All rights reserved.    Copyright  VHI. All rights reserved.

## 2019-12-09 ENCOUNTER — Ambulatory Visit: Payer: Medicaid Other | Admitting: Occupational Therapy

## 2019-12-13 ENCOUNTER — Other Ambulatory Visit: Payer: Self-pay

## 2019-12-13 ENCOUNTER — Encounter: Payer: Self-pay | Admitting: Occupational Therapy

## 2019-12-13 ENCOUNTER — Ambulatory Visit: Payer: Medicaid Other | Admitting: Occupational Therapy

## 2019-12-13 DIAGNOSIS — M6281 Muscle weakness (generalized): Secondary | ICD-10-CM

## 2019-12-13 DIAGNOSIS — M25532 Pain in left wrist: Secondary | ICD-10-CM

## 2019-12-13 DIAGNOSIS — M25632 Stiffness of left wrist, not elsewhere classified: Secondary | ICD-10-CM

## 2019-12-13 NOTE — Therapy (Signed)
[facility] [address] [place], [place], [address] Phone: [phone]   Fax:  [phone]  Occupational Therapy Treatment  Patient Details  Name: Tammie Cook MRN: [id number] Date of Birth: 1980/12/03 Referring Provider (OT): Dr. Roda Shutters   Encounter Date: 12/13/2019   OT End of Session - 12/13/19 0929    Visit Number 7    Number of Visits 11    Date for OT Re-Evaluation 12/13/19    Authorization Type UHC medicaid, no auth required    Authorization Time Period pt used 10 visits with PT previously, 27 visit limit    Authorization - Visit Number 7    Authorization - Number of Visits 10    OT Start Time 0803    OT Stop Time 0845    OT Time Calculation (min) 42 min    Activity Tolerance Patient tolerated treatment well    Behavior During Therapy [facility] for tasks assessed/performed           History reviewed. No pertinent past medical history.  Past Surgical History:  Procedure Laterality Date  . CESAREAN SECTION      There were no vitals filed for this visit.   Subjective Assessment - 12/13/19 0813    Subjective  Pt reports continued pain in wrist    Currently in Pain? Yes    Pain Score 2     Pain Location Wrist    Pain Orientation Left    Pain Descriptors / Indicators Aching    Pain Type Chronic pain    Pain Onset More than a month ago    Pain Frequency Intermittent    Aggravating Factors  use    Pain Relieving Factors brace, heat                   Fluidotherapy x 11 mins to LUE for pain and stiffness with no adverse reactions.  Ultrasound , 0.8 w/cm 2, 20% x 8 mins, to dorsal wrist, no adverse reactions   Reviewed A/ROM HEP (wrist flex, ext, ulnar/ radial deviation, supination/ pronation) then pt performed with 1lbs weight and pt returned demo each x15.  Yellow putty HEP issued, pt returned demonstration following v.c   Discussed importance of activity modification with  neutral wrist, and use of brace prn. compressive sleeve issued due to mild edema.   Discussed plans for d/c next visit.                      OT Education - 12/13/19 0929    Education Details reveiwed strengthening with 1 lbs weight for wrist flex/ ext, supination/ pronation, ulnar/ radial deviation. Yellow putty HEP, see pt instructions, importance of neutral wrist with activity.    Person(s) Educated Patient    Methods Explanation;Demonstration;Verbal cues;Handout    Comprehension Verbalized understanding;Returned demonstration;Verbal cues required               OT Long Term Goals - 12/13/19 0846      OT LONG TERM GOAL #1   Title I with HEP    Status Achieved      OT LONG TERM GOAL #2   Title I with activity modification, splint wear and pain reduction strategies    Status On-going      OT LONG TERM GOAL #3   Status Achieved   wrist flexion/ extension 75/60     OT LONG TERM GOAL #4   Title Pt will increase LUE grip strength by 5 lbs  for increased functional use during ADLs.    Status On-going      OT LONG TERM GOAL #5   Title Pt will report she has resumed use of LUE as a non dominant assist for ADLS/IADLS at least 90% of the time with pain less than or equal to 2/10.    Status On-going                 Plan - 12/13/19 0930    Clinical Impression Statement Pt is progressing towards goals. She is tolerating light strengthening.anticipate d/c next visit.    OT Occupational Profile and History Problem Focused Assessment - Including review of records relating to presenting problem    Occupational performance deficits (Please refer to evaluation for details): ADL's;IADL's;Rest and Sleep;Leisure;Work;Social Participation    Body Structure / Function / Physical Skills ADL;UE functional use;Flexibility;Pain;FMC;ROM;Coordination;GMC;Decreased knowledge of precautions;Sensation;Decreased knowledge of use of DME;IADL;Dexterity;Strength;Edema    Rehab  Potential Good    Clinical Decision Making Limited treatment options, no task modification necessary    Comorbidities Affecting Occupational Performance: None    Modification or Assistance to Complete Evaluation  No modification of tasks or assist necessary to complete eval    OT Frequency 2x / week    OT Duration --   5 weeks   OT Treatment/Interventions Self-care/ADL training;Ultrasound;Energy conservation;Patient/family education;DME and/or AE instruction;Iontophoresis;Paraffin;Passive range of motion;Cryotherapy;Fluidtherapy;Splinting;Contrast Bath;Electrical Stimulation;Moist Heat;Therapeutic exercise;Manual Therapy;Therapeutic activities;Neuromuscular education    Plan continue with A/ROM, P/ROM and gentle strengthening with a 1 lbs weight, yellow putty, d/c next visit    Consulted and Agree with Plan of Care Patient;Other (Comment)   interpreter via stratus          Patient will benefit from skilled therapeutic intervention in order to improve the following deficits and impairments:   Body Structure / Function / Physical Skills: ADL, UE functional use, Flexibility, Pain, FMC, ROM, Coordination, GMC, Decreased knowledge of precautions, Sensation, Decreased knowledge of use of DME, IADL, Dexterity, Strength, Edema       Visit Diagnosis: Pain in left wrist  Stiffness of left wrist, not elsewhere classified  Muscle weakness (generalized)    Problem List There are no problems to display for this patient.   Skyler Carel 12/13/2019, 9:33 AM Keene Breath, OTR/L [phone] Phone: [phone] 9:33 AM 12/13/19 [facility] [address] [place], [place], [address] Phone: [phone]   Fax:  [phone]  Name: Zhaniya Swallows MRN: [id number] Date of Birth: 1980/03/18

## 2019-12-13 NOTE — Patient Instructions (Signed)
Pinch: Palmar    Pellizque la masilla con el pulgar derecho y la punta de cada uno de los dedos. Repita _10___ veces. Haga __1-2__ sesiones por da. Actividad: Pele frutas como naranjas o limones.* Despegue calcomanas de una superficie.  Copyright  VHI. All rights reserved.  Putty-in-Your-Hand    Apriete lentamente una masilla o pelota de goma blanda mientras respira normalmente. Repita con la otra mano. Repita la serie _10___ veces. Haga _-_2__ sesiones por da.  [email or web address].us/885   Copyright  VHI. All rights reserved.

## 2019-12-15 ENCOUNTER — Ambulatory Visit: Payer: Medicaid Other | Admitting: Occupational Therapy

## 2019-12-15 ENCOUNTER — Encounter: Payer: Self-pay | Admitting: Occupational Therapy

## 2019-12-15 ENCOUNTER — Other Ambulatory Visit: Payer: Self-pay

## 2019-12-15 DIAGNOSIS — M25632 Stiffness of left wrist, not elsewhere classified: Secondary | ICD-10-CM

## 2019-12-15 DIAGNOSIS — M6281 Muscle weakness (generalized): Secondary | ICD-10-CM

## 2019-12-15 DIAGNOSIS — M25532 Pain in left wrist: Secondary | ICD-10-CM | POA: Diagnosis not present

## 2019-12-15 NOTE — Therapy (Signed)
[facility] [address] [place], [place], [address] Phone: [phone]   Fax:  [phone]  Occupational Therapy Treatment  Patient Details  Name: Tammie Cook MRN: [id number] Date of Birth: Jul 23, 1980 Referring Provider (OT): Dr. Erlinda Hong   Encounter Date: 12/15/2019   OT End of Session - 12/15/19 0726    Visit Number 8    Number of Visits 11    Date for OT Re-Evaluation 12/13/19    Authorization Type [employer] medicaid, no auth required    Authorization Time Period pt used 10 visits with PT previously, 27 visit limit    Authorization - Visit Number 8    Authorization - Number of Visits 10    OT Start Time 0719    OT Stop Time 0800    OT Time Calculation (min) 41 min    Activity Tolerance Patient tolerated treatment well    Behavior During Therapy [facility] for tasks assessed/performed           History reviewed. No pertinent past medical history.  Past Surgical History:  Procedure Laterality Date  . CESAREAN SECTION      There were no vitals filed for this visit.   Subjective Assessment - 12/15/19 0725    Subjective  Pt reports continued pain in wrist    Patient is accompanied by: Interpreter   via video   Currently in Pain? Yes    Pain Score 2     Pain Location Wrist    Pain Orientation Left    Pain Descriptors / Indicators Aching    Pain Onset More than a month ago    Pain Frequency Intermittent    Aggravating Factors  use    Pain Relieving Factors brace, heat              Fluidotherapy x 10 mins to LUE for pain and stiffness with no adverse reactions.  Ultrasound 30mz, 0.8 w/cm 2, 20% x 8 mins, to dorsal wrist, no adverse reactions   PROM wrist flex/ext for stretch   AROM wrist flex, ext, ulnar/ radial deviation, supination/ pronation x10 each,  then pt performed with 1lbs weight and pt returned demo each x15.  Reviewed Yellow putty HEP and pt returned demo each   Assessed remaining  goals and discussed progress and d/c recrommendations to gradually incr activity/wean from splint slowly as long as pain stays low/continues to decrease.  Reviewed recommendations for activity modifications including using splint prn/maintaining neutral wrist with activity.  Recommended pt continue with HEP for 2-4 weeks as long as pain is improving and then schedule follow up with MD prn if pt continues to have pain or if pain increases.  Pt verbalized understanding of recommendations.         OT Long Term Goals - 12/15/19 0726      OT LONG TERM GOAL #1   Title I with HEP    Status Achieved      OT LONG TERM GOAL #2   Title I with activity modification, splint wear and pain reduction strategies    Status Achieved      OT LONG TERM GOAL #3   Title Pt will demonstrate L wrist flexion/ extension WFLS for ADLS/IADLS    Baseline Wrist flexion/ extension 50/ 45    Status Achieved   wrist flexion/ extension 75/60     OT LONG TERM GOAL #4   Title Pt will increase LUE grip strength by 5 lbs for increased functional use during ADLs.  Status Achieved   12/15/19:  33.9lbs     OT LONG TERM GOAL #5   Title Pt will report she has resumed use of LUE as a non dominant assist for ADLS/IADLS at least 86% of the time with pain less than or equal to 2/10.    Status Partially Met   12/15/19:  able for light tasks, unable for heavier tasks and using RUE more for bilat tasks, 2-3/10 pain at most                Plan - 12/15/19 0726    Clinical Impression Statement Pt is progressing towards goals. She is tolerating light strengthening.anticipate d/c next visit.    OT Occupational Profile and History Problem Focused Assessment - Including review of records relating to presenting problem    Occupational performance deficits (Please refer to evaluation for details): ADL's;IADL's;Rest and Sleep;Leisure;Work;Social Participation    Body Structure / Function / Physical Skills ADL;UE functional  use;Flexibility;Pain;FMC;ROM;Coordination;GMC;Decreased knowledge of precautions;Sensation;Decreased knowledge of use of DME;IADL;Dexterity;Strength;Edema    Rehab Potential Good    Clinical Decision Making Limited treatment options, no task modification necessary    Comorbidities Affecting Occupational Performance: None    Modification or Assistance to Complete Evaluation  No modification of tasks or assist necessary to complete eval    OT Frequency 2x / week    OT Duration --   5 weeks   OT Treatment/Interventions Self-care/ADL training;Ultrasound;Energy conservation;Patient/family education;DME and/or AE instruction;Iontophoresis;Paraffin;Passive range of motion;Cryotherapy;Fluidtherapy;Splinting;Contrast Bath;Electrical Stimulation;Moist Heat;Therapeutic exercise;Manual Therapy;Therapeutic activities;Neuromuscular education    Plan continue with A/ROM, P/ROM and gentle strengthening with a 1 lbs weight, yellow putty, d/c next visit    Consulted and Agree with Plan of Care Patient;Other (Comment)   interpreter via stratus          Patient will benefit from skilled therapeutic intervention in order to improve the following deficits and impairments:   Body Structure / Function / Physical Skills: ADL, UE functional use, Flexibility, Pain, FMC, ROM, Coordination, GMC, Decreased knowledge of precautions, Sensation, Decreased knowledge of use of DME, IADL, Dexterity, Strength, Edema       Visit Diagnosis: Pain in left wrist  Stiffness of left wrist, not elsewhere classified  Muscle weakness (generalized)    Problem List There are no problems to display for this patient.   OCCUPATIONAL THERAPY DISCHARGE SUMMARY  Visits from Start of Care: 8  Current functional level related to goals / functional outcomes: See above   Remaining deficits: Mild pain, decr strength   Education / Equipment: Pt instructed in HEP and activity modifications to decr pain.  Plan: Patient agrees to  discharge.  Patient goals were not met. Patient is being discharged due to meeting the stated rehab goals.  Pt met 4/5 LTGs and reports decr overall pain.  Recommended pt to continue with HEP for 2-4 weeks and then follow up with MD prn if continued pain. ?????           [facility] 12/15/2019, 9:25 AM  [facility] [address] [place], [place], [address] Phone: [phone]   Fax:  [phone]  Name: Tammie Cook MRN: [id number] Date of Birth: July 11, 1980   Vianne Bulls, OTR/L [facility] [address] [place], [place]  [address] [phone] phone [phone] 12/15/19 9:25 AM

## 2020-01-12 ENCOUNTER — Other Ambulatory Visit: Payer: Self-pay

## 2020-01-12 ENCOUNTER — Encounter (INDEPENDENT_AMBULATORY_CARE_PROVIDER_SITE_OTHER): Payer: Medicaid Other | Admitting: Ophthalmology

## 2020-01-12 DIAGNOSIS — H4423 Degenerative myopia, bilateral: Secondary | ICD-10-CM | POA: Diagnosis not present

## 2020-01-12 DIAGNOSIS — H43813 Vitreous degeneration, bilateral: Secondary | ICD-10-CM | POA: Diagnosis not present

## 2020-01-12 DIAGNOSIS — H2513 Age-related nuclear cataract, bilateral: Secondary | ICD-10-CM | POA: Diagnosis not present

## 2020-01-12 DIAGNOSIS — H18053 Posterior corneal pigmentations, bilateral: Secondary | ICD-10-CM

## 2020-02-08 ENCOUNTER — Telehealth: Payer: Self-pay | Admitting: Orthopaedic Surgery

## 2020-02-08 NOTE — Telephone Encounter (Signed)
Received medical records request from patient     Forwarding to [facility] today

## 2020-06-20 ENCOUNTER — Other Ambulatory Visit: Payer: Self-pay

## 2020-06-20 ENCOUNTER — Encounter: Payer: Self-pay | Admitting: Family Medicine

## 2020-06-20 ENCOUNTER — Ambulatory Visit: Payer: Medicaid Other | Attending: Family Medicine | Admitting: Family Medicine

## 2020-06-20 VITALS — BP 120/80 | HR 71 | Temp 98.1°F | Ht 62.0 in | Wt 186.2 lb

## 2020-06-20 DIAGNOSIS — Z13228 Encounter for screening for other metabolic disorders: Secondary | ICD-10-CM

## 2020-06-20 DIAGNOSIS — Z0000 Encounter for general adult medical examination without abnormal findings: Secondary | ICD-10-CM | POA: Diagnosis not present

## 2020-06-20 DIAGNOSIS — Z1231 Encounter for screening mammogram for malignant neoplasm of breast: Secondary | ICD-10-CM

## 2020-06-20 DIAGNOSIS — Z6834 Body mass index (BMI) 34.0-34.9, adult: Secondary | ICD-10-CM | POA: Diagnosis not present

## 2020-06-20 DIAGNOSIS — E6609 Other obesity due to excess calories: Secondary | ICD-10-CM

## 2020-06-20 DIAGNOSIS — K219 Gastro-esophageal reflux disease without esophagitis: Secondary | ICD-10-CM | POA: Diagnosis not present

## 2020-06-20 MED ORDER — OMEPRAZOLE 40 MG PO CPDR: 40.0000 mg | DELAYED_RELEASE_CAPSULE | Freq: Every day | ORAL | 6 refills | Status: AC

## 2020-06-20 MED ORDER — CETIRIZINE HCL 10 MG PO TABS: 10.0000 mg | ORAL_TABLET | Freq: Every day | ORAL | 1 refills | Status: DC

## 2020-06-20 MED ORDER — OLOPATADINE HCL 0.1 % OP SOLN: 1.0000 [drp] | Freq: Two times a day (BID) | OPHTHALMIC | 1 refills | Status: AC

## 2020-06-20 NOTE — Patient Instructions (Addendum)
Mantenimiento de la salud en las mujeres Health Maintenance, Female Adoptar un estilo de vida saludable y recibir atencin preventiva son importantes para promover la salud y el bienestar. Consulte al mdico sobre:  El esquema adecuado para hacerse pruebas y exmenes peridicos.  Cosas que puede hacer por su cuenta para prevenir enfermedades y mantenerse sana. Qu debo saber sobre la dieta, el peso y el ejercicio? Consuma una dieta saludable  Consuma una dieta que incluya muchas verduras, frutas, productos lcteos con bajo contenido de grasa y protenas magras.  No consuma muchos alimentos ricos en grasas slidas, azcares agregados o sodio.   Mantenga un peso saludable El ndice de masa muscular (IMC) se utiliza para identificar problemas de peso. Proporciona una estimacin de la grasa corporal basndose en el peso y la altura. Su mdico puede ayudarle a determinar su IMC y a lograr o mantener un peso saludable. Haga ejercicio con regularidad Haga ejercicio con regularidad. Esta es una de las prcticas ms importantes que puede hacer por su salud. La mayora de los adultos deben seguir estas pautas:  Realizar, al menos, 150minutos de actividad fsica por semana. El ejercicio debe aumentar la frecuencia cardaca y hacerlo transpirar (ejercicio de intensidad moderada).  Hacer ejercicios de fortalecimiento por lo menos dos veces por semana. Agregue esto a su plan de ejercicio de intensidad moderada.  Pasar menos tiempo sentados. Incluso la actividad fsica ligera puede ser beneficiosa. Controle sus niveles de colesterol y lpidos en la sangre Comience a realizarse anlisis de lpidos y colesterol en la sangre a los 20aos y luego reptalos cada 5aos. Hgase controlar los niveles de colesterol con mayor frecuencia si:  Sus niveles de lpidos y colesterol son altos.  Es mayor de 40aos.  Presenta un alto riesgo de padecer enfermedades cardacas. Qu debo saber sobre las pruebas de  deteccin del cncer? Segn su historia clnica y sus antecedentes familiares, es posible que deba realizarse pruebas de deteccin del cncer en diferentes edades. Esto puede incluir pruebas de deteccin de lo siguiente:  Cncer de mama.  Cncer de cuello uterino.  Cncer colorrectal.  Cncer de piel.  Cncer de pulmn. Qu debo saber sobre la enfermedad cardaca, la diabetes y la hipertensin arterial? Presin arterial y enfermedad cardaca  La hipertensin arterial causa enfermedades cardacas y aumenta el riesgo de accidente cerebrovascular. Es ms probable que esto se manifieste en las personas que tienen lecturas de presin arterial alta, tienen ascendencia africana o tienen sobrepeso.  Hgase controlar la presin arterial: ? Cada 3 a 5 aos si tiene entre 18 y 39 aos. ? Todos los aos si es mayor de 40aos. Diabetes Realcese exmenes de deteccin de la diabetes con regularidad. Este anlisis revisa el nivel de azcar en la sangre en ayunas. Hgase las pruebas de deteccin:  Cada tresaos despus de los 40aos de edad si tiene un peso normal y un bajo riesgo de padecer diabetes.  Con ms frecuencia y a partir de una edad inferior si tiene sobrepeso o un alto riesgo de padecer diabetes. Qu debo saber sobre la prevencin de infecciones? Hepatitis B Si tiene un riesgo ms alto de contraer hepatitis B, debe someterse a un examen de deteccin de este virus. Hable con el mdico para averiguar si tiene riesgo de contraer la infeccin por hepatitis B. Hepatitis C Se recomienda el anlisis a:  Todos los que nacieron entre 1945 y 1965.  Todas las personas que tengan un riesgo de haber contrado hepatitis C. Enfermedades de transmisin sexual (ETS)  Hgase   las pruebas de deteccin de ITS, incluidas la gonorrea y la clamidia, si: ? Es sexualmente activa y es menor de 24aos. ? Es mayor de 24aos, y el mdico le informa que corre riesgo de tener este tipo de  infecciones. ? La actividad sexual ha cambiado desde que le hicieron la ltima prueba de deteccin y tiene un riesgo mayor de tener clamidia o gonorrea. Pregntele al mdico si usted tiene riesgo.  Pregntele al mdico si usted tiene un alto riesgo de contraer VIH. El mdico tambin puede recomendarle un medicamento recetado para ayudar a evitar la infeccin por el VIH. Si elige tomar medicamentos para prevenir el VIH, primero debe hacerse los anlisis de deteccin del VIH. Luego debe hacerse anlisis cada 3meses mientras est tomando los medicamentos. Embarazo  Si est por dejar de menstruar (fase premenopusica) y usted puede quedar embarazada, busque asesoramiento antes de quedar embarazada.  Tome de 400 a 800microgramos (mcg) de cido flico todos los das si queda embarazada.  Pida mtodos de control de la natalidad (anticonceptivos) si desea evitar un embarazo no deseado. Osteoporosis y menopausia La osteoporosis es una enfermedad en la que los huesos pierden los minerales y la fuerza por el avance de la edad. El resultado pueden ser fracturas en los huesos. Si tiene 65aos o ms, o si est en riesgo de sufrir osteoporosis y fracturas, pregunte a su mdico si debe:  Hacerse pruebas de deteccin de prdida sea.  Tomar un suplemento de calcio o de vitamina D para reducir el riesgo de fracturas.  Recibir terapia de reemplazo hormonal (TRH) para tratar los sntomas de la menopausia. Siga estas instrucciones en su casa: Estilo de vida  No consuma ningn producto que contenga nicotina o tabaco, como cigarrillos, cigarrillos electrnicos y tabaco de mascar. Si necesita ayuda para dejar de fumar, consulte al mdico.  No consuma drogas.  No comparta agujas.  Solicite ayuda a su mdico si necesita apoyo o informacin para abandonar las drogas. Consumo de alcohol  No beba alcohol si: ? Su mdico le indica no hacerlo. ? Est embarazada, puede estar embarazada o est tratando de quedar  embarazada.  Si bebe alcohol: ? Limite la cantidad que consume de 0 a 1 medida por da. ? Limite la ingesta si est amamantando.  Est atento a la cantidad de alcohol que hay en las bebidas que toma. En los [place], una medida equivale a una botella de cerveza de 12oz (355ml), un vaso de vino de 5oz (148ml) o un vaso de una bebida alcohlica de alta graduacin de 1oz (44ml). Instrucciones generales  Realcese los estudios de rutina de la salud, dentales y de la vista.  Mantngase al da con las vacunas.  Infrmele a su mdico si: ? Se siente deprimida con frecuencia. ? Alguna vez ha sido vctima de maltrato o no se siente segura en su casa. Resumen  Adoptar un estilo de vida saludable y recibir atencin preventiva son importantes para promover la salud y el bienestar.  Siga las instrucciones del mdico acerca de una dieta saludable, el ejercicio y la realizacin de pruebas o exmenes para detectar enfermedades.  Siga las instrucciones del mdico con respecto al control del colesterol y la presin arterial. Esta informacin no tiene como fin reemplazar el consejo del mdico. Asegrese de hacerle al mdico cualquier pregunta que tenga. Document Revised: 02/03/2018 Document Reviewed: 02/03/2018 Elsevier Patient Education  2021 Elsevier Inc.  

## 2020-06-20 NOTE — Progress Notes (Signed)
Subjective:  Patient ID: Tammie Cook, female    DOB: 1980/09/14  Age: 40 y.o. MRN: [id number]  CC: Annual Exam and Medication Refill (Omeprazole )   HPI Tammie Cook is a 40 year old female who presents today for an annual physical exam.  She is currently on her menstrual cycle and will be unable to undergo Pap smear today. Due for Pap smear and mammogram. Last Pap smear was normal in 03/2017.  Interval History: She has had seasonal allergy symptoms, sneezing, itchy eyes, tearing from the eyes since the pollen began. Currently on omeprazole for reflux and is requesting a refill.  No past medical history on file.  Past Surgical History:  Procedure Laterality Date  . CESAREAN SECTION      Family History  Problem Relation Age of Onset  . Heart disease Mother     No Known Allergies  Outpatient Medications Prior to Visit  Medication Sig Dispense Refill  . omeprazole (PRILOSEC) 40 MG capsule Take 1 capsule (40 mg total) by mouth daily. 30 capsule 3  . ibuprofen (ADVIL) 600 MG tablet Take 1 tablet (600 mg total) by mouth every 8 (eight) hours as needed. (Patient not taking: Reported on 06/20/2020) 60 tablet 1  . meloxicam (MOBIC) 7.5 MG tablet Take 1 tablet (7.5 mg total) by mouth 2 (two) times daily as needed for pain. (Patient not taking: Reported on 06/20/2020) 30 tablet 2  . cyclobenzaprine (FLEXERIL) 10 MG tablet Take 1 tablet (10 mg total) by mouth 2 (two) times daily as needed for muscle spasms. 60 tablet 1  . predniSONE (STERAPRED UNI-PAK 21 TAB) 10 MG (21) TBPK tablet Take as directed 21 tablet 0  . predniSONE (STERAPRED UNI-PAK 21 TAB) 5 MG (21) TBPK tablet Take as directed 21 tablet 0  . traMADol (ULTRAM) 50 MG tablet Take 1 tablet (50 mg total) by mouth at bedtime. (Patient not taking: Reported on 09/01/2019) 30 tablet 0   No facility-administered medications prior to visit.     ROS Review of Systems  Constitutional: Negative for activity change,  appetite change and fatigue.  HENT: Positive for rhinorrhea and sneezing. Negative for congestion, sinus pressure and sore throat.   Eyes: Positive for itching. Negative for visual disturbance.  Respiratory: Negative for cough, chest tightness, shortness of breath and wheezing.   Cardiovascular: Negative for chest pain and palpitations.  Gastrointestinal: Negative for abdominal distention, abdominal pain and constipation.  Endocrine: Negative for polydipsia.  Genitourinary: Negative for dysuria and frequency.  Musculoskeletal: Negative for arthralgias and back pain.  Skin: Negative for rash.  Neurological: Negative for tremors, light-headedness and numbness.  Hematological: Does not bruise/bleed easily.  Psychiatric/Behavioral: Negative for agitation and behavioral problems.    Objective:  BP 120/80 (BP Location: Right Arm, Patient Position: Sitting, Cuff Size: Large)   Pulse 71   Temp 98.1 F (36.7 C) (Oral)   Ht _0  (1.575 m)   Wt 186 lb 3.2 oz (84.5 kg)   SpO2 95%   BMI 34.06 kg/m   BP/Weight 06/20/2020 09/01/2019 7/68/0881  Systolic BP 103 - 159  Diastolic BP 80 - 82  Wt. (Lbs) 186.2 180 179.6  BMI 34.06 30.9 32.85      Physical Exam Constitutional:      General: She is not in acute distress.    Appearance: She is well-developed. She is not diaphoretic.  HENT:     Head: Normocephalic.     Right Ear: External ear normal.     Left  Ear: External ear normal.     Nose: Nose normal.  Eyes:     Conjunctiva/sclera: Conjunctivae normal.     Pupils: Pupils are equal, round, and reactive to light.  Neck:     Vascular: No JVD.  Cardiovascular:     Rate and Rhythm: Normal rate and regular rhythm.     Heart sounds: Normal heart sounds. No murmur heard. No gallop.   Pulmonary:     Effort: Pulmonary effort is normal. No respiratory distress.     Breath sounds: Normal breath sounds. No wheezing or rales.  Chest:     Chest wall: No tenderness.  Abdominal:     General:  Bowel sounds are normal. There is no distension.     Palpations: Abdomen is soft. There is no mass.     Tenderness: There is no abdominal tenderness.  Musculoskeletal:        General: No tenderness. Normal range of motion.     Cervical back: Normal range of motion.  Skin:    General: Skin is warm and dry.  Neurological:     Mental Status: She is alert and oriented to person, place, and time.     Deep Tendon Reflexes: Reflexes are normal and symmetric.     CMP Latest Ref Rng & Units 03/16/2019  Glucose 65 - 99 mg/dL 98  BUN 6 - 20 mg/dL 10  Creatinine 0.57 - 1.00 mg/dL 0.61  Sodium 134 - 144 mmol/L 136  Potassium 3.5 - 5.2 mmol/L 4.6  Chloride 96 - 106 mmol/L 103  CO2 20 - 29 mmol/L 21  Calcium 8.7 - 10.2 mg/dL 9.3  Total Protein 6.0 - 8.5 g/dL 7.4  Total Bilirubin 0.0 - 1.2 mg/dL 0.4  Alkaline Phos 39 - 117 IU/L 43  AST 0 - 40 IU/L 14  ALT 0 - 32 IU/L 13    Lipid Panel     Component Value Date/Time   CHOL 178 03/16/2019 1033   TRIG 106 03/16/2019 1033   HDL 46 03/16/2019 1033   CHOLHDL 3.9 03/16/2019 1033   LDLCALC 113 (H) 03/16/2019 1033    CBC    Component Value Date/Time   WBC 6.8 03/16/2019 1033   RBC 4.58 03/16/2019 1033   HGB 13.2 03/16/2019 1033   HCT 39.7 03/16/2019 1033   PLT 246 03/16/2019 1033   MCV 87 03/16/2019 1033   MCH 28.8 03/16/2019 1033   MCHC 33.2 03/16/2019 1033   RDW 12.7 03/16/2019 1033   LYMPHSABS 1.8 03/16/2019 1033   EOSABS 0.1 03/16/2019 1033   BASOSABS 0.1 03/16/2019 1033    Lab Results  Component Value Date   HGBA1C 5.5 02/09/2019    Assessment & Plan:  1. Annual physical exam Counseled on 150 minutes of exercise per week, healthy eating (including decreased daily intake of saturated fats, cholesterol, added sugars, sodium), routine healthcare maintenance. - CMP14+EGFR - CBC with Differential/Platelet - VITAMIN D 25 Hydroxy (Vit-D Deficiency, Fractures)  2. Encounter for screening mammogram for malignant neoplasm of  breast - MM 3D SCREEN BREAST BILATERAL; Future  3. Gastroesophageal reflux disease without esophagitis Controlled - omeprazole (PRILOSEC) 40 MG capsule; Take 1 capsule (40 mg total) by mouth daily.  Dispense: 30 capsule; Refill: 6  4. Screening for metabolic disorder She is not fasting hence lipid panel cannot be obtained today - Hemoglobin A1c - T4, free - TSH  5. Class 1 obesity due to excess calories without serious comorbidity with body mass index (BMI) of 34.0 to 34.9  in adult Counseled on working on an exercise regimen starting at 10 minutes of walking per day up to goal of 150 minutes of moderate intensity exercise per week Avoid late meals.   Health Care Maintenance: Pap smear at next visit Meds ordered this encounter  Medications  . omeprazole (PRILOSEC) 40 MG capsule    Sig: Take 1 capsule (40 mg total) by mouth daily.    Dispense:  30 capsule    Refill:  6  . olopatadine (PATADAY) 0.1 % ophthalmic solution    Sig: Place 1 drop into both eyes 2 (two) times daily.    Dispense:  5 mL    Refill:  1  . cetirizine (ZYRTEC) 10 MG tablet    Sig: Take 1 tablet (10 mg total) by mouth daily.    Dispense:  30 tablet    Refill:  1    Follow-up: Return in about 1 month (around 07/21/2020) for PAP smear.       Charlott Rakes, MD, FAAFP. [facility] [place], [place]   06/20/2020, 3:16 PM

## 2020-06-21 LAB — CBC WITH DIFFERENTIAL/PLATELET
Basophils Absolute: 0.1 10*3/uL (ref 0.0–0.2)
Basos: 1 %
EOS (ABSOLUTE): 0.4 10*3/uL (ref 0.0–0.4)
Eos: 5 %
Hematocrit: 41.1 % (ref 34.0–46.6)
Hemoglobin: 13 g/dL (ref 11.1–15.9)
Immature Grans (Abs): 0 10*3/uL (ref 0.0–0.1)
Immature Granulocytes: 0 %
Lymphocytes Absolute: 2.2 10*3/uL (ref 0.7–3.1)
Lymphs: 28 %
MCH: 27.8 pg (ref 26.6–33.0)
MCHC: 31.6 g/dL (ref 31.5–35.7)
MCV: 88 fL (ref 79–97)
Monocytes Absolute: 0.6 10*3/uL (ref 0.1–0.9)
Monocytes: 8 %
Neutrophils Absolute: 4.4 10*3/uL (ref 1.4–7.0)
Neutrophils: 58 %
Platelets: 290 10*3/uL (ref 150–450)
RBC: 4.68 x10E6/uL (ref 3.77–5.28)
RDW: 12.4 % (ref 11.7–15.4)
WBC: 7.6 10*3/uL (ref 3.4–10.8)

## 2020-06-21 LAB — VITAMIN D 25 HYDROXY (VIT D DEFICIENCY, FRACTURES): Vit D, 25-Hydroxy: 17.9 ng/mL — ABNORMAL LOW (ref 30.0–100.0)

## 2020-06-21 LAB — CMP14+EGFR
ALT: 17 IU/L (ref 0–32)
AST: 15 IU/L (ref 0–40)
Albumin/Globulin Ratio: 1.5 (ref 1.2–2.2)
Albumin: 4.4 g/dL (ref 3.8–4.8)
Alkaline Phosphatase: 42 IU/L — ABNORMAL LOW (ref 44–121)
BUN/Creatinine Ratio: 18 (ref 9–23)
BUN: 11 mg/dL (ref 6–24)
Bilirubin Total: 0.2 mg/dL (ref 0.0–1.2)
CO2: 20 mmol/L (ref 20–29)
Calcium: 9.5 mg/dL (ref 8.7–10.2)
Chloride: 101 mmol/L (ref 96–106)
Creatinine, Ser: 0.6 mg/dL (ref 0.57–1.00)
Globulin, Total: 2.9 g/dL (ref 1.5–4.5)
Glucose: 111 mg/dL — ABNORMAL HIGH (ref 65–99)
Potassium: 4.3 mmol/L (ref 3.5–5.2)
Sodium: 137 mmol/L (ref 134–144)
Total Protein: 7.3 g/dL (ref 6.0–8.5)
eGFR: 116 mL/min/{1.73_m2} (ref >59)

## 2020-06-21 LAB — HEMOGLOBIN A1C
Est. average glucose Bld gHb Est-mCnc: 120 mg/dL
Hgb A1c MFr Bld: 5.8 % — ABNORMAL HIGH (ref 4.8–5.6)

## 2020-06-21 LAB — T4, FREE: Free T4: 1.24 ng/dL (ref 0.82–1.77)

## 2020-06-21 LAB — TSH: TSH: 0.796 u[IU]/mL (ref 0.450–4.500)

## 2020-06-22 ENCOUNTER — Telehealth: Payer: Self-pay

## 2020-06-22 ENCOUNTER — Other Ambulatory Visit: Payer: Self-pay | Admitting: Family Medicine

## 2020-06-22 MED ORDER — ERGOCALCIFEROL 1.25 MG (50000 UT) PO CAPS: 50000.0000 [IU] | ORAL_CAPSULE | ORAL | 1 refills | Status: AC

## 2020-06-22 NOTE — Telephone Encounter (Signed)
Patient name and DOB has been verified Patient was informed of lab results. Patient had no questions.  

## 2020-06-22 NOTE — Telephone Encounter (Signed)
-----   Message from Hoy Register, MD sent at 06/22/2020  9:59 AM EDT ----- Please inform her that she has a normal cholesterol, normal kidney and liver function. Vitamin D is low and I have sent a rx for Drisdol to her Pharmacy and we will recheck her level at her next visit. A1c is 5.8 (Prediabetic). Please advise to work on low carb diet, decrease sweets and increase exercise to prevent development of DM.

## 2020-07-18 ENCOUNTER — Ambulatory Visit
Admission: RE | Admit: 2020-07-18 | Discharge: 2020-07-18 | Disposition: A | Discharge status: Home / Self Care | Payer: Medicaid Other | Source: Ambulatory Visit | Attending: Family Medicine | Admitting: Family Medicine

## 2020-07-18 ENCOUNTER — Other Ambulatory Visit: Payer: Self-pay

## 2020-07-18 DIAGNOSIS — Z1231 Encounter for screening mammogram for malignant neoplasm of breast: Secondary | ICD-10-CM | POA: Diagnosis not present

## 2020-07-20 ENCOUNTER — Other Ambulatory Visit: Payer: Self-pay | Admitting: Family Medicine

## 2020-07-20 DIAGNOSIS — R928 Other abnormal and inconclusive findings on diagnostic imaging of breast: Secondary | ICD-10-CM

## 2020-08-01 ENCOUNTER — Ambulatory Visit: Payer: Medicaid Other | Attending: Family Medicine | Admitting: Family Medicine

## 2020-08-01 ENCOUNTER — Encounter: Payer: Self-pay | Admitting: Family Medicine

## 2020-08-01 ENCOUNTER — Other Ambulatory Visit (HOSPITAL_COMMUNITY)
Admission: RE | Admit: 2020-08-01 | Discharge: 2020-08-01 | Disposition: A | Discharge status: Home / Self Care | Payer: Medicaid Other | Source: Ambulatory Visit | Attending: Family Medicine | Admitting: Family Medicine

## 2020-08-01 ENCOUNTER — Other Ambulatory Visit: Payer: Self-pay

## 2020-08-01 VITALS — BP 121/80 | HR 76 | Ht 62.0 in | Wt 183.0 lb

## 2020-08-01 DIAGNOSIS — Z124 Encounter for screening for malignant neoplasm of cervix: Secondary | ICD-10-CM

## 2020-08-01 DIAGNOSIS — I8393 Asymptomatic varicose veins of bilateral lower extremities: Secondary | ICD-10-CM

## 2020-08-01 DIAGNOSIS — K219 Gastro-esophageal reflux disease without esophagitis: Secondary | ICD-10-CM | POA: Insufficient documentation

## 2020-08-01 MED ORDER — IBUPROFEN 600 MG PO TABS: 600.0000 mg | ORAL_TABLET | Freq: Three times a day (TID) | ORAL | 1 refills | Status: DC | PRN

## 2020-08-01 NOTE — Progress Notes (Signed)
Subjective:  Patient ID: Tammie Cook, female    DOB: [birth year]  Age: 40 y.o. MRN: [id number]  CC: Gynecologic Exam   HPI Tammie Cook is a 40 year old female with a history of GERD who presents today for Pap smear.  At her last annual physical exam she was on her menstrual cycle.  Interval History: Her GERD is controlled. She recently underwent varicose vein surgery and would like a prescription for ibuprofen sent to her pharmacy. Her recent mammogram revealed questionable masses in both breasts and additional imaging was scheduled.  No past medical history on file.  Past Surgical History:  Procedure Laterality Date   CESAREAN SECTION      Family History  Problem Relation Age of Onset   Heart disease Mother     No Known Allergies  Outpatient Medications Prior to Visit  Medication Sig Dispense Refill   cetirizine (ZYRTEC) 10 MG tablet Take 1 tablet (10 mg total) by mouth daily. 30 tablet 1   ergocalciferol (DRISDOL) 1.25 MG (50000 UT) capsule Take 1 capsule (50,000 Units total) by mouth once a week. 12 capsule 1   omeprazole (PRILOSEC) 40 MG capsule Take 1 capsule (40 mg total) by mouth daily. 30 capsule 6   ibuprofen (ADVIL) 600 MG tablet Take 1 tablet (600 mg total) by mouth every 8 (eight) hours as needed. 60 tablet 1   olopatadine (PATADAY) 0.1 % ophthalmic solution Place 1 drop into both eyes 2 (two) times daily. (Patient not taking: Reported on 08/01/2020) 5 mL 1   meloxicam (MOBIC) 7.5 MG tablet Take 1 tablet (7.5 mg total) by mouth 2 (two) times daily as needed for pain. (Patient not taking: No sig reported) 30 tablet 2   No facility-administered medications prior to visit.     ROS Review of Systems  Constitutional:  Negative for activity change, appetite change and fatigue.  HENT:  Negative for congestion, sinus pressure and sore throat.   Eyes:  Negative for visual disturbance.  Respiratory:  Negative for cough, chest tightness, shortness of  breath and wheezing.   Cardiovascular:  Negative for chest pain and palpitations.  Gastrointestinal:  Negative for abdominal distention, abdominal pain and constipation.  Endocrine: Negative for polydipsia.  Genitourinary:  Negative for dysuria and frequency.  Musculoskeletal:  Negative for arthralgias and back pain.       Leg pains  Skin:  Negative for rash.  Neurological:  Negative for tremors, light-headedness and numbness.  Hematological:  Does not bruise/bleed easily.  Psychiatric/Behavioral:  Negative for agitation and behavioral problems.    Objective:  BP 121/80   Pulse 76   Ht 5\' 2"  (1.575 m)   Wt 183 lb (83 kg)   LMP 07/21/2020   SpO2 97%   BMI 33.47 kg/m   BP/Weight 08/01/2020 06/20/2020 09/01/2019  Systolic BP 121 120 -  Diastolic BP 80 80 -  Wt. (Lbs) 183 186.2 180  BMI 33.47 34.06 30.9      Physical Exam Constitutional:      Appearance: She is well-developed.  Neck:     Vascular: No JVD.  Cardiovascular:     Rate and Rhythm: Normal rate.     Heart sounds: Normal heart sounds. No murmur heard. Pulmonary:     Effort: Pulmonary effort is normal.     Breath sounds: Normal breath sounds. No wheezing or rales.  Chest:     Chest wall: No tenderness.  Abdominal:     General: Bowel sounds are normal. There is no  distension.     Palpations: Abdomen is soft. There is no mass.     Tenderness: no abdominal tenderness  Genitourinary:    Comments: External genitalia, vagina, cervix, adnexa-normal Musculoskeletal:        General: Normal range of motion.     Right lower leg: No edema.     Left lower leg: No edema.     Comments: Surgical scar in the left upper medial thigh at site of surgery  Neurological:     Mental Status: She is alert and oriented to person, place, and time.  Psychiatric:        Mood and Affect: Mood normal.    CMP Latest Ref Rng & Units 06/20/2020 03/16/2019  Glucose 65 - 99 mg/dL 644(I) 98  BUN 6 - 24 mg/dL 11 10  Creatinine 3.47 - 1.00 mg/dL  4.25 9.56  Sodium 387 - 144 mmol/L 137 136  Potassium 3.5 - 5.2 mmol/L 4.3 4.6  Chloride 96 - 106 mmol/L 101 103  CO2 20 - 29 mmol/L 20 21  Calcium 8.7 - 10.2 mg/dL 9.5 9.3  Total Protein 6.0 - 8.5 g/dL 7.3 7.4  Total Bilirubin 0.0 - 1.2 mg/dL 0.2 0.4  Alkaline Phos 44 - 121 IU/L 42(L) 43  AST 0 - 40 IU/L 15 14  ALT 0 - 32 IU/L 17 13    Lipid Panel     Component Value Date/Time   CHOL 178 03/16/2019 1033   TRIG 106 03/16/2019 1033   HDL 46 03/16/2019 1033   CHOLHDL 3.9 03/16/2019 1033   LDLCALC 113 (H) 03/16/2019 1033    CBC    Component Value Date/Time   WBC 7.6 06/20/2020 1520   RBC 4.68 06/20/2020 1520   HGB 13.0 06/20/2020 1520   HCT 41.1 06/20/2020 1520   PLT 290 06/20/2020 1520   MCV 88 06/20/2020 1520   MCH 27.8 06/20/2020 1520   MCHC 31.6 06/20/2020 1520   RDW 12.4 06/20/2020 1520   LYMPHSABS 2.2 06/20/2020 1520   EOSABS 0.4 06/20/2020 1520   BASOSABS 0.1 06/20/2020 1520    Lab Results  Component Value Date   HGBA1C 5.8 (H) 06/20/2020    Assessment & Plan:  1. Varicose veins of both lower extremities, unspecified whether complicated Status post venous stripping Continue compression stockings Follow-up with vascular - ibuprofen (ADVIL) 600 MG tablet; Take 1 tablet (600 mg total) by mouth every 8 (eight) hours as needed.  Dispense: 60 tablet; Refill: 1  2. Screening for cervical cancer - Cytology - PAP([place])     Meds ordered this encounter  Medications   ibuprofen (ADVIL) 600 MG tablet    Sig: Take 1 tablet (600 mg total) by mouth every 8 (eight) hours as needed.    Dispense:  60 tablet    Refill:  1    Follow-up: Return if symptoms worsen or fail to improve.       Hoy Register, MD, FAAFP. [facility] [place], [place] [phone]   08/01/2020, 9:40 AM

## 2020-08-01 NOTE — Patient Instructions (Signed)
Prueba de Papanicolaou Pap Test Por qu me debo realizar esta prueba? La prueba de Papanicolaou, tambin denominada citologa vaginal, es una prueba de cribado para detectar signos de: Cncer de la vagina, del cuello uterino y del tero. El cuello uterino es la parte baja del tero que se abre hacia la vagina. Infeccin. Cambios que podran ser un signo de que se est desarrollando un cncer (cambios precancerosos). Las mujeres deben realizarse esta prueba con regularidad. En general, debe hacerse una prueba de Papanicolaou cada 3 aos hasta alcanzar la menopausia o hasta los 65 aos. Las mujeres entre 30 y 60 aos de edad pueden elegir realizarse la prueba de Papanicolaou al mismo tiempo que la prueba del VPH (virus del papiloma humano) cada 5 aos (en lugar de cada 3 aos). El mdico puede recomendarle que se realice pruebas de Papanicolaou con ms o menos frecuencia en funcin de sus afecciones mdicas y los resultados de laprueba de Papanicolaou anterior. Qu tipo de muestra se toma?  El mdico recolectar una muestra de clulas de la superficie del cuello uterino. Lo har utilizando un pequeo hisopo de algodn, una esptula de plstico o un cepillo. Esta muestra se recolecta durante un examen plvico, mientras usted est recostada boca arriba sobre la mesa de examen con los pies en los descansos para pies (estribos). En algunos casos, tambin pueden recolectarse fluidos (secreciones) del cuello uterino y la vagina. Cmo debo prepararme para este anlisis? Tenga en cuenta en qu etapa del ciclo menstrual se encuentra. Es posible que se le pida que vuelva a programar la prueba si est menstruando el da en que debe realizrsela. Si el da en que debe realizarse la prueba tiene una infeccin vaginal aparente, deber volver a programar la prueba. Siga las instrucciones del mdico acerca de lo siguiente: Cambiar o suspender los medicamentos que usa habitualmente. Algunos medicamentos pueden  provocar resultados anormales de la prueba, como los digitlicos y la tetraciclina. Evite las duchas vaginales o los baos de inmersin el da de la prueba o el da anterior. Informe al mdico acerca de lo siguiente: Cualquier alergia que tenga. Todos los medicamentos que usa, incluidos vitaminas, hierbas, gotas oftlmicas, cremas y medicamentos de venta libre. Cualquier trastorno de la sangre que tenga. Cirugas a las que se haya sometido. Cualquier afeccin mdica que tenga. Si est embarazada o podra estarlo. Cmo se informan los resultados? Los resultados de la prueba se informarn como anormales o normales. Puede producirse un resultado positivo falso. Este tipo de resultado es incorrecto porque indica que una afeccin est presente cuando en realidad nolo est. Puede producirse un resultado negativo falso. Este tipo de resultado es incorrecto porque indica que una afeccin no est presente cuando en realidadlo est. Qu significan los resultados? Un resultado normal en la prueba significa que no tiene signos de cncer de lavagina, del cuello uterino o del tero. Un resultado anormal puede significar que tiene: Cncer. Una prueba de Papanicolaou por s sola no es suficiente para diagnosticar cncer. En este caso, se le realizarn ms pruebas. Cambios precancerosos en la vagina, cuello uterino o tero. Inflamacin del cuello uterino. Enfermedades de transmisin sexual (ETS). Infecciones por hongos. Infecciones por parsitos. Hable con su mdico sobre lo que significan sus resultados. Preguntas para hacerle al mdico Consulte a su mdico o pregunte en el departamento donde se realiza la prueba acerca de lo siguiente: Cundo estarn disponibles mis resultados? Cmo obtendr mis resultados? Cules son las opciones de tratamiento? Qu otras pruebas necesito? Cules son los prximos   pasos que debo seguir? Resumen En general, las mujeres deben hacerse una prueba de Papanicolaou  cada 3 aos hasta alcanzar la menopausia o hasta los 65 aos de edad. El mdico recolectar una muestra de clulas de la superficie del cuello uterino. Lo har utilizando un pequeo hisopo de algodn, una esptula de plstico o un cepillo. En algunos casos, tambin pueden recolectarse fluidos (secreciones) del cuello uterino y la vagina. Esta informacin no tiene como fin reemplazar el consejo del mdico. Asegresede hacerle al mdico cualquier pregunta que tenga. Document Revised: 11/08/2019 Document Reviewed: 11/08/2019 Elsevier Patient Education  2022 Elsevier Inc.  

## 2020-08-03 LAB — CYTOLOGY - PAP
Comment: NEGATIVE
Diagnosis: NEGATIVE
High risk HPV: NEGATIVE

## 2020-08-10 ENCOUNTER — Ambulatory Visit
Admission: RE | Admit: 2020-08-10 | Discharge: 2020-08-10 | Disposition: A | Discharge status: Home / Self Care | Payer: Medicaid Other | Source: Ambulatory Visit | Attending: Family Medicine | Admitting: Family Medicine

## 2020-08-10 ENCOUNTER — Other Ambulatory Visit: Payer: Self-pay

## 2020-08-10 DIAGNOSIS — R928 Other abnormal and inconclusive findings on diagnostic imaging of breast: Secondary | ICD-10-CM

## 2020-08-14 ENCOUNTER — Other Ambulatory Visit: Payer: Self-pay | Admitting: Family Medicine

## 2020-09-24 ENCOUNTER — Other Ambulatory Visit: Payer: Self-pay | Admitting: Family Medicine

## 2020-09-24 DIAGNOSIS — I8393 Asymptomatic varicose veins of bilateral lower extremities: Secondary | ICD-10-CM

## 2020-09-24 NOTE — Telephone Encounter (Signed)
Requested Prescriptions  Pending Prescriptions Disp Refills  . ibuprofen (ADVIL) 600 MG tablet [Pharmacy Med Name: IBUPROFEN 600 MG TABLET] 60 tablet 1    Sig: TAKE 1 TABLET BY MOUTH EVERY 8 HOURS AS NEEDED.     Analgesics:  NSAIDS Passed - 09/24/2020  2:02 PM      Passed - Cr in normal range and within 360 days    Creatinine, Ser  Date Value Ref Range Status  06/20/2020 0.60 0.57 - 1.00 mg/dL Final         Passed - HGB in normal range and within 360 days    Hemoglobin  Date Value Ref Range Status  06/20/2020 13.0 11.1 - 15.9 g/dL Final         Passed - Patient is not pregnant      Passed - Valid encounter within last 12 months    Recent Outpatient Visits          1 month ago Screening for cervical cancer   [facility] [place], Odette Horns, MD   3 months ago Annual physical exam   [facility] Hoy Register, MD   1 year ago Left wrist pain   [facility] [place], Odette Horns, MD   1 year ago Acute right-sided low back pain without sciatica   [facility] Hoy Register, MD   1 year ago Annual physical exam   [facility] Hoy Register, MD

## 2020-10-21 DIAGNOSIS — H5213 Myopia, bilateral: Secondary | ICD-10-CM | POA: Diagnosis not present

## 2020-11-03 ENCOUNTER — Other Ambulatory Visit: Payer: Self-pay | Admitting: Family Medicine

## 2020-11-03 DIAGNOSIS — I8393 Asymptomatic varicose veins of bilateral lower extremities: Secondary | ICD-10-CM

## 2020-11-03 NOTE — Telephone Encounter (Signed)
Requested Prescriptions  Pending Prescriptions Disp Refills  . ibuprofen (ADVIL) 600 MG tablet [Pharmacy Med Name: IBUPROFEN 600 MG TABLET] 60 tablet 1    Sig: TOME UNA TABLETA CADA OCHO HORAS CUANDO SEA NECESARIO     Analgesics:  NSAIDS Passed - 11/03/2020 12:21 PM      Passed - Cr in normal range and within 360 days    Creatinine, Ser  Date Value Ref Range Status  06/20/2020 0.60 0.57 - 1.00 mg/dL Final         Passed - HGB in normal range and within 360 days    Hemoglobin  Date Value Ref Range Status  06/20/2020 13.0 11.1 - 15.9 g/dL Final         Passed - Patient is not pregnant      Passed - Valid encounter within last 12 months    Recent Outpatient Visits          3 months ago Screening for cervical cancer   [facility] [place], Odette Horns, MD   4 months ago Annual physical exam   [facility] Hoy Register, MD   1 year ago Left wrist pain   [facility] [place], Odette Horns, MD   1 year ago Acute right-sided low back pain without sciatica   [facility] Hoy Register, MD   1 year ago Annual physical exam   [facility] Hoy Register, MD
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
## 2021-01-24 IMAGING — XA DG FLUORO GUIDE NDL PLC/BX
1 series · 1 of 1 positions shown · non-contrast
Comparison: none

CLINICAL DATA: Left wrist pain.

[Series 1: ortho standard · 1 of 1 slices shown]
[im 1/1]
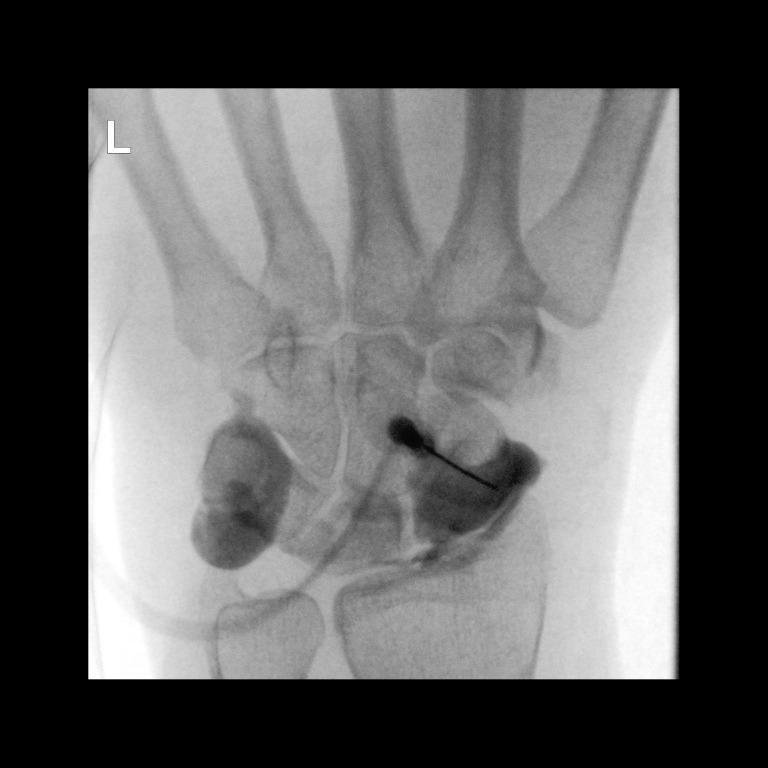

[1 of 1 positions shown; findings below may reference images not displayed]

FLUOROSCOPY TIME:  Radiation Exposure Index (as provided by the
fluoroscopic device): 1.78 uGy*m2

PROCEDURE:
Left WRIST INJECTION UNDER FLUOROSCOPY

An appropriate skin entrance site was determined. The site was
marked, prepped with Betadine, draped in the usual sterile fashion,
and infiltrated locally with 1% Lidocaine. A 25 gauge skin needle
was advanced into the radiocarpal joint under intermittent
fluoroscopy. 2.0 mL of a mixture of 0.1 mL MultiHance in 10 mL of
dilute Isovue M 200 was then used to opacify the proximal carpal
joint. No immediate complication.
IMPRESSION: Technically successful left wrist injection for MRI.

## 2021-05-28 ENCOUNTER — Ambulatory Visit: Payer: Medicaid Other | Admitting: Physician Assistant

## 2021-06-04 ENCOUNTER — Encounter: Payer: Self-pay | Admitting: Physician Assistant

## 2021-06-04 ENCOUNTER — Ambulatory Visit (INDEPENDENT_AMBULATORY_CARE_PROVIDER_SITE_OTHER): Payer: Medicaid Other | Admitting: Physician Assistant

## 2021-06-04 VITALS — BP 130/87 | HR 78 | Temp 98.5°F | Resp 18 | Ht 62.0 in | Wt 181.0 lb

## 2021-06-04 DIAGNOSIS — R5383 Other fatigue: Secondary | ICD-10-CM

## 2021-06-04 DIAGNOSIS — E559 Vitamin D deficiency, unspecified: Secondary | ICD-10-CM

## 2021-06-04 DIAGNOSIS — E6609 Other obesity due to excess calories: Secondary | ICD-10-CM

## 2021-06-04 DIAGNOSIS — Z6833 Body mass index (BMI) 33.0-33.9, adult: Secondary | ICD-10-CM

## 2021-06-04 DIAGNOSIS — R7303 Prediabetes: Secondary | ICD-10-CM | POA: Diagnosis not present

## 2021-06-04 DIAGNOSIS — Z1159 Encounter for screening for other viral diseases: Secondary | ICD-10-CM

## 2021-06-04 DIAGNOSIS — L659 Nonscarring hair loss, unspecified: Secondary | ICD-10-CM | POA: Diagnosis not present

## 2021-06-04 LAB — POCT GLYCOSYLATED HEMOGLOBIN (HGB A1C): Hemoglobin A1C: 5.7 % — AB (ref 4.0–5.6)

## 2021-06-04 NOTE — Patient Instructions (Signed)
I do encourage you to work on increasing the amount of sleep, you should aim for 7 to 8 hours of sleep at night. ? ?I encourage you to increase your water intake, you should drink at least 64 ounces of water a day.  Continue working on healthy eating as well. ? ?I encourage you to get outside and do 10 to 15 minutes of walking in the morning to help be exposed to the morning light, this can help reset your master clock and help with fatigue. ? ?We will call you with today's lab results ? ?Kennieth Rad, PA-C ?Physician Assistant ?[facility] ?[email or web address] ? ? ?Fatiga ?Fatigue ?Si siente fatiga, tiene una sensaci?n de cansancio en todo momento y falta de energ?a o falta de motivaci?n. La fatiga puede dificultar el inicio o la realizaci?n de tareas debido al agotamiento. ?La fatiga ocasional o leve con frecuencia es una reacci?n normal a la actividad o la vida. Sin embargo, la fatiga de Engineer, site duraci?n (cr?nica) o extrema puede ser s?ntoma de una afecci?n m?dica, por ejemplo: ?Depresi?n. ?No tener suficientes gl?bulos rojos o hemoglobina en la sangre (anemia). ?Un problema con una gl?ndula peque?a [place] en la parte inferior delantera del cuello (trastorno tiroideo). ?Afecciones reumatol?gicas. Estos son problemas relacionados con el sistema de defensa del cuerpo (sistema inmunitario). ?Infecciones, especialmente ciertas infecciones virales. ?Con el transcurso del tiempo, la fatiga tambi?n puede tener consecuencias negativas en la salud. ?Siga estas instrucciones en su casa: ?Medicamentos ?Use los medicamentos de venta libre y los recetados solamente como se lo haya indicado el m?dico. ?Tome una multivitamina, si se lo indic? el m?dico. ?No tome ning?n complemento alimenticio o a base de hierbas a menos que lo haya autorizado el m?dico. ?Comida y bebida ? ?Evite las comidas abundantes a la noche. ?Consuma una dieta bien balanceada que incluya prote?nas  magras, cereales integrales, abundantes frutas, verduras y productos l?cteos descremados. ?Evite comer o beber demasiados productos que contengan cafe?na. ?Evite tomar alcohol. ?Beber suficiente l?quido como para mantener la orina de color amarillo p?lido. ?Actividad ? ?Realice ejercicio con regularidad como se lo haya indicado el m?dico. ?Practique t?cnicas que lo ayuden a relajarse, como yoga, tai chi, meditaci?n, masoterapia. ?[place] ?Cambie las situaciones que le provocan estr?s. Trate de que sus horarios de [place] y personal est?n equilibrados. ?No use drogas ilegales ni recreativas. ?Instrucciones generales ?Controle su fatiga para ver si hay cambios. ?Acu?stese y lev?ntese a la misma hora todos los d?as. ?Evite la fatiga moderando su ritmo durante el d?a y durmiendo lo suficiente durante la noche. ?Mantenga un peso saludable. ?Comun?quese con un m?dico si: ?La fatiga no mejora. ?Tiene fiebre. ?Pierde peso o [place] de peso de forma repentina. ?Tiene dolores de [place]. ?Tiene problemas para conciliar el sue?o o para dormir en la noche. ?Se siente enojado, culpable, ansioso o triste. ?Observa hinchaz?n en las piernas u otras partes del cuerpo. ?Solicite ayuda de inmediato si: ?Se siente confundido, siente que va a desmayarse o se desmaya. ?Tiene la visi?n borrosa o tiene dolor de cabeza intenso. ?Siente dolor intenso en el abdomen, la espalda o el ?rea entre la cintura y las caderas (pelvis). ?Tiene dolor de pecho, dificultad para respirar, o latidos card?acos irregulares o acelerados. ?No puede orinar u orina menos de lo normal. ?Presenta sangrado anormal del recto, la nariz, los pulmones, los pezones o, si es [place], de la vagina. ?Vomita sangre. ?Tiene pensamientos acerca de Sport and exercise psychologist a Producer, television/film/video. ?Estos s?ntomas pueden Sales executive. Solicite  ayuda de inmediato. Llame al 911. ?No espere a ver si los s?ntomas desaparecen. ?No conduzca por sus propios medios [employer]. ?Busque ayuda de inmediato si alguna vez siente que puede hacerse da?o a usted mismo o a otros, o tiene pensamientos de poner fin a su vida. Dir?jase al centro de urgencias m?s cercano o: ?Llame al 911. ?Llame a National Suicide Prevention Lifeline (L?nea Telef?nica Nacional para la Prevenci?n del Suicidio) al [phone] o al 988. Est? disponible las 24 horas del d?a. ?Env?e un mensaje de texto a la l?nea para casos de crisis al [phone]. ?Resumen ?Si siente fatiga, tiene una sensaci?n de cansancio en todo momento y falta de energ?a o falta de motivaci?n. ?La fatiga puede dificultar el inicio o la realizaci?n de tareas debido al agotamiento. ?La fatiga de larga duraci?n (cr?nica) o extrema puede ser s?ntoma de una afecci?n m?dica. ?Realice ejercicio con regularidad como se lo haya indicado el m?dico. ?Cambie las situaciones que le provocan estr?s. Trate de que sus horarios de [place] y personal est?n equilibrados. ?Esta informaci?n no tiene Marine scientist el consejo del m?dico. Aseg?rese de hacerle al m?dico cualquier pregunta que tenga. ?Document Revised: 11/22/2020 Document Reviewed: 11/22/2020 ?Elsevier Patient Education ? [place]. ? ?

## 2021-06-04 NOTE — Progress Notes (Signed)
Patient has had coffee with cream and sugar, ?Patient has taken medication today. ?Patient reports losing her hair and extreme evening fatigue for the past 3 months. ? ?

## 2021-06-04 NOTE — Progress Notes (Signed)
? ?Established Patient Office Visit ? ?Subjective   ?Patient ID: Amyiah Gaba, female    DOB: 1980-04-11  Age: 41 y.o. MRN: [id number] ? ?Chief Complaint  ?Patient presents with  ? Insomnia  ?   ?  ? Fatigue  ? Alopecia  ? ? ?States that she has been having feelings of fatigue, irritability, and has noticed hair thinning over the past 3 months.  States that she is sleeping approximately 6 hours a night.  States that she works 6 hours a day, states that she feels very fatigued and is unable to do everyday activities after 3 or 4:00 in the afternoon. ? ?States that her stress level is low, does not drink very much water, states that her diet is healthy, denies any changes to her menses. ? ?States that she has noticed the hair thinning, notices hair in the shower, denies any patches of baldness.   ? ? ?History reviewed. No pertinent past medical history. ?Social History  ? ?Socioeconomic History  ? Marital status: Single  ?  Spouse name: Not on file  ? Number of children: Not on file  ? Years of education: Not on file  ? Highest education level: Not on file  ?Occupational History  ? Not on file  ?Tobacco Use  ? Smoking status: Never  ? Smokeless tobacco: Never  ?Substance and Sexual Activity  ? Alcohol use: Never  ? Drug use: Never  ? Sexual activity: Yes  ?  Birth control/protection: Condom  ?Other Topics Concern  ? Not on file  ?Social History Narrative  ? Not on file  ? ?Social Determinants of Health  ? ?Financial Resource Strain: Not on file  ?Food Insecurity: Not on file  ?Transportation Needs: Not on file  ?Physical Activity: Not on file  ?Stress: Not on file  ?Social Connections: Not on file  ?Intimate Partner Violence: Not on file  ? ?Family History  ?Problem Relation Age of Onset  ? Heart disease Mother   ? ?No Known Allergies ?  ? ?Review of Systems  ?Constitutional:  Positive for malaise/fatigue.  ?HENT: Negative.    ?Eyes: Negative.   ?Respiratory:  Negative for shortness of breath.    ?Cardiovascular:  Negative for chest pain.  ?Gastrointestinal: Negative.   ?Genitourinary: Negative.   ?Musculoskeletal: Negative.   ?Skin: Negative.   ?Neurological: Negative.   ?Endo/Heme/Allergies: Negative.   ?Psychiatric/Behavioral:  Negative for depression. The patient is not nervous/anxious and does not have insomnia.   ? ?  ?Objective:  ?  ? ?BP 130/87 (BP Location: Right Arm, Patient Position: Sitting, Cuff Size: Normal)   Pulse 78   Temp 98.5 ?F (36.9 ?C) (Oral)   Resp 18   Ht 5\' 2"  (1.575 m)   Wt 181 lb (82.1 kg)   LMP 06/04/2021   SpO2 95%   BMI 33.11 kg/m?  ?Wt Readings from Last 3 Encounters:  ?06/04/21 181 lb (82.1 kg)  ?08/01/20 183 lb (83 kg)  ?06/20/20 186 lb 3.2 oz (84.5 kg)  ? ?  ? ?Physical Exam ?Vitals and nursing note reviewed.  ?Constitutional:   ?   Appearance: Normal appearance.  ?HENT:  ?   Head: Normocephalic and atraumatic. Hair is abnormal.  ?   Comments: Frontal hair thinning noted  ?   Right Ear: External ear normal.  ?   Left Ear: External ear normal.  ?   Nose: Nose normal.  ?   Mouth/Throat:  ?   Mouth: Mucous membranes are moist.  ?  Pharynx: Oropharynx is clear.  ?Eyes:  ?   Extraocular Movements: Extraocular movements intact.  ?   Conjunctiva/sclera: Conjunctivae normal.  ?   Pupils: Pupils are equal, round, and reactive to light.  ?Cardiovascular:  ?   Rate and Rhythm: Normal rate and regular rhythm.  ?   Pulses: Normal pulses.  ?   Heart sounds: Normal heart sounds.  ?Pulmonary:  ?   Effort: Pulmonary effort is normal.  ?   Breath sounds: Normal breath sounds.  ?Musculoskeletal:     ?   General: Normal range of motion.  ?   Cervical back: Normal range of motion and neck supple.  ?Skin: ?   General: Skin is warm and dry.  ?Neurological:  ?   General: No focal deficit present.  ?   Mental Status: She is alert and oriented to person, place, and time. Mental status is at baseline.  ?Psychiatric:     ?   Mood and Affect: Mood normal.     ?   Behavior: Behavior normal.      ?   Thought Content: Thought content normal.     ?   Judgment: Judgment normal.  ? ? ? ?  ?Assessment & Plan:  ? ?Problem List Items Addressed This Visit   ? ?  ? Other  ? Prediabetes  ? Relevant Orders  ? HgB A1c (Completed)  ? Vitamin D deficiency  ? Relevant Orders  ? Vitamin D, 25-hydroxy  ? ?Other Visit Diagnoses   ? ? Fatigue, unspecified type    -  Primary  ? Relevant Orders  ? TSH  ? Basic metabolic panel  ? CBC with Differential/Platelet  ? Hair thinning      ? Encounter for HCV screening test for low risk patient      ? Relevant Orders  ? HCV Ab w Reflex to Quant PCR  ? Class 1 obesity due to excess calories without serious comorbidity with body mass index (BMI) of 33.0 to 33.9 in adult      ? ?  ?1. Fatigue, unspecified type ?Patient encouraged to work on increasing sleep to at least 7 hours.  Patient education given on supportive care.  Red flags given for prompt reevaluation ?- TSH ?- Basic metabolic panel ?- CBC with Differential/Platelet ? ?2. Hair thinning ? ? ?3. Prediabetes ?A1c 5.7.  Patient education given on low sugar diet ?- HgB A1c ? ?4. Vitamin D deficiency ? ?- Vitamin D, 25-hydroxy ? ?5. Encounter for HCV screening test for low risk patient ? ?- HCV Ab w Reflex to Quant PCR ? ?6. Class 1 obesity due to excess calories without serious comorbidity with body mass index (BMI) of 33.0 to 33.9 in adult ? ? ?I have reviewed the patient's medical history (PMH, PSH, Social History, Family History, Medications, and allergies) , and have been updated if relevant. I spent 30 minutes reviewing chart and  face to face time with patient. ? ? ? ? ?Return if symptoms worsen or fail to improve.  ? ? ?Johnay Mano S Mayers, PA-C ? ?

## 2021-06-05 LAB — HCV AB W REFLEX TO QUANT PCR: HCV Ab: NONREACTIVE

## 2021-06-05 LAB — HCV INTERPRETATION

## 2021-06-05 LAB — CBC WITH DIFFERENTIAL/PLATELET
Basophils Absolute: 0.1 10*3/uL (ref 0.0–0.2)
Basos: 1 %
EOS (ABSOLUTE): 0.5 10*3/uL — ABNORMAL HIGH (ref 0.0–0.4)
Eos: 7 %
Hematocrit: 41.1 % (ref 34.0–46.6)
Hemoglobin: 13.5 g/dL (ref 11.1–15.9)
Immature Grans (Abs): 0.1 10*3/uL (ref 0.0–0.1)
Immature Granulocytes: 1 %
Lymphocytes Absolute: 2 10*3/uL (ref 0.7–3.1)
Lymphs: 31 %
MCH: 28.7 pg (ref 26.6–33.0)
MCHC: 32.8 g/dL (ref 31.5–35.7)
MCV: 87 fL (ref 79–97)
Monocytes Absolute: 0.5 10*3/uL (ref 0.1–0.9)
Monocytes: 7 %
Neutrophils Absolute: 3.3 10*3/uL (ref 1.4–7.0)
Neutrophils: 53 %
Platelets: 233 10*3/uL (ref 150–450)
RBC: 4.71 x10E6/uL (ref 3.77–5.28)
RDW: 12.7 % (ref 11.7–15.4)
WBC: 6.3 10*3/uL (ref 3.4–10.8)

## 2021-06-05 LAB — BASIC METABOLIC PANEL
BUN/Creatinine Ratio: 19 (ref 9–23)
BUN: 13 mg/dL (ref 6–24)
CO2: 19 mmol/L — ABNORMAL LOW (ref 20–29)
Calcium: 9.3 mg/dL (ref 8.7–10.2)
Chloride: 101 mmol/L (ref 96–106)
Creatinine, Ser: 0.67 mg/dL (ref 0.57–1.00)
Glucose: 101 mg/dL — ABNORMAL HIGH (ref 70–99)
Potassium: 4.8 mmol/L (ref 3.5–5.2)
Sodium: 137 mmol/L (ref 134–144)
eGFR: 113 mL/min/{1.73_m2} (ref >59)

## 2021-06-05 LAB — VITAMIN D 25 HYDROXY (VIT D DEFICIENCY, FRACTURES): Vit D, 25-Hydroxy: 30.1 ng/mL (ref 30.0–100.0)

## 2021-06-05 LAB — TSH: TSH: 0.761 u[IU]/mL (ref 0.450–4.500)

## 2021-06-06 ENCOUNTER — Telehealth: Payer: Self-pay | Admitting: *Deleted

## 2021-06-06 NOTE — Telephone Encounter (Signed)
Medical Assistant used [employer] to contact patient.  ?Interpreter Name: Barnet Pall #: [id number] ?Patient is aware of results via VM per signed DPR. ?

## 2021-06-06 NOTE — Telephone Encounter (Signed)
-----   Message from Roney Jaffe, PA-C sent at 06/06/2021  7:45 AM EDT ----- ?Please call patient and let her know that her thyroid function, and kidney function are within normal limits.  She does not show signs of anemia.  Her vitamin D levels are within normal limits.  Her screening for hepatitis C was negative. ?

## 2021-09-25 ENCOUNTER — Other Ambulatory Visit: Payer: Self-pay | Admitting: Family Medicine

## 2021-09-25 DIAGNOSIS — Z1231 Encounter for screening mammogram for malignant neoplasm of breast: Secondary | ICD-10-CM

## 2021-10-31 ENCOUNTER — Ambulatory Visit
Admission: RE | Admit: 2021-10-31 | Discharge: 2021-10-31 | Disposition: A | Discharge status: Home / Self Care | Payer: Medicaid Other | Source: Ambulatory Visit | Attending: Family Medicine | Admitting: Family Medicine

## 2021-10-31 DIAGNOSIS — Z1231 Encounter for screening mammogram for malignant neoplasm of breast: Secondary | ICD-10-CM

## 2021-12-11 ENCOUNTER — Encounter: Payer: Self-pay | Admitting: Physician Assistant

## 2021-12-11 ENCOUNTER — Ambulatory Visit: Payer: Medicaid Other | Attending: Physician Assistant | Admitting: Physician Assistant

## 2021-12-11 VITALS — BP 129/85 | HR 82 | Wt 185.4 lb

## 2021-12-11 DIAGNOSIS — Z8632 Personal history of gestational diabetes: Secondary | ICD-10-CM

## 2021-12-11 DIAGNOSIS — Z789 Other specified health status: Secondary | ICD-10-CM

## 2021-12-11 DIAGNOSIS — N912 Amenorrhea, unspecified: Secondary | ICD-10-CM | POA: Diagnosis not present

## 2021-12-11 DIAGNOSIS — O09521 Supervision of elderly multigravida, first trimester: Secondary | ICD-10-CM

## 2021-12-11 DIAGNOSIS — Z3201 Encounter for pregnancy test, result positive: Secondary | ICD-10-CM

## 2021-12-11 LAB — GLUCOSE, POCT (MANUAL RESULT ENTRY): POC Glucose: 91 mg/dl (ref 70–99)

## 2021-12-11 LAB — POCT URINE PREGNANCY: Preg Test, Ur: POSITIVE — AB

## 2021-12-11 NOTE — Progress Notes (Signed)
Patient states to taking pregnancy test in 12/08/21 and it came back positive

## 2021-12-11 NOTE — Progress Notes (Signed)
Patient ID: Tammie Cook, female   DOB: 21-Nov-1980, 41 y.o.   MRN: [id number]    Katalaya Beel, is a 41 y.o. female  GEX:[id number]  [id number]:[id number]  DOB - 09-May-1980  Chief Complaint  Patient presents with   Amenorrhea       Subjective:   Tammie Cook is a 41 y.o. female here today for positive home pregnancy test.  G1P1.  1st day of last period was 11/10/2021.  Tested +12/08/2021 at home.    C-sxn at [redacted] weeks pregnant 6 years ago in January. Pregnancy with gestational diabetes.    Feeling good except tired.  Taking PNV.  No spotting/cramping  No problems updated.  ALLERGIES: No Known Allergies  PAST MEDICAL HISTORY: No past medical history on file.  MEDICATIONS AT HOME: Prior to Admission medications   Medication Sig Start Date End Date Taking? Authorizing Provider  cetirizine (ZYRTEC) 10 MG tablet TOME UNA TABLETA TODOS LOS DIAS Patient not taking: Reported on 12/11/2021 08/14/20   Hoy Register, MD  ergocalciferol (DRISDOL) 1.25 MG (50000 UT) capsule Take 1 capsule (50,000 Units total) by mouth once a week. Patient not taking: Reported on 12/11/2021 06/22/20   Hoy Register, MD  ibuprofen (ADVIL) 600 MG tablet TOME UNA TABLETA CADA OCHO HORAS CUANDO SEA NECESARIO Patient not taking: Reported on 12/11/2021 11/03/20   Hoy Register, MD  olopatadine (PATADAY) 0.1 % ophthalmic solution Place 1 drop into both eyes 2 (two) times daily. Patient not taking: Reported on 08/01/2020 06/20/20   Hoy Register, MD  omeprazole (PRILOSEC) 40 MG capsule Take 1 capsule (40 mg total) by mouth daily. Patient not taking: Reported on 12/11/2021 06/20/20   Hoy Register, MD    ROS: Neg HEENT Neg resp Neg cardiac Neg GI Neg GU Neg MS Neg psych Neg neuro  Objective:   Vitals:   12/11/21 1550  BP: 129/85  Pulse: 82  SpO2: 99%  Weight: 185 lb 6.4 oz (84.1 kg)   Exam General appearance : Awake, alert, not in any distress. Speech Clear. Not toxic  looking HEENT: Atraumatic and Normocephalic Neck: Supple, no JVD. No cervical lymphadenopathy.  Chest: Good air entry bilaterally, CTAB.  No rales/rhonchi/wheezing CVS: S1 S2 regular, no murmurs.  Extremities: B/L Lower Ext shows no edema, both legs are warm to touch Neurology: Awake alert, and oriented X 3, CN II-XII intact, Non focal Skin: No Rash  Data Review Lab Results  Component Value Date   HGBA1C 5.7 (A) 06/04/2021   HGBA1C 5.8 (H) 06/20/2020   HGBA1C 5.5 02/09/2019    Assessment & Plan   1. Amenorrhea +pregnancy. [facility] 08/18/2022 - POCT urine pregnancy  2. Positive pregnancy test [facility] 08/18/2022 based on 1st day LMP on 10/15 - POCT urine pregnancy    3. Multigravida of advanced maternal age in first trimester - Ambulatory referral to Obstetrics / Gynecology-ASAP bc higher risk - Glucose (CBG)  5. History of gestational diabetes Blood sugar ok today - Ambulatory referral to Obstetrics / Gynecology - Glucose (CBG)  6. Language barrier [employer] "Ranauldo" interpreters used and additional time performing visit was required.     Return if symptoms worsen or fail to improve.  The patient was given clear instructions to go to ER or return to medical center if symptoms don't improve, worsen or new problems develop. The patient verbalized understanding. The patient was told to call to get lab results if they haven't heard anything in the next week.      Georgian Co, PA-C [facility] [place], [place] [phone]   12/11/2021, 4:49 PM

## 2021-12-11 NOTE — Patient Instructions (Signed)
Your estimated due date based on the first day of your last period is August 18, 2022.

## 2021-12-23 ENCOUNTER — Ambulatory Visit: Payer: Self-pay | Admitting: *Deleted

## 2021-12-23 NOTE — Telephone Encounter (Signed)
Summary: positive covid   Patient called in, tested positive for covid,  wants to speak with nurse to know if this could affect the baby.      Called patient to review questions regarding positive covid status and pregnancy. Patient reports she is "a few weeks pregnant" and would like to know how her being positive for covid may affect the fetus. Reviewed with patient covid can increase risk of preterm labor earlier than 37 weeks. Reviewed sx and patient denies chest pain no difficulty breathing. Sx mild. Please advise     Reason for Disposition  [1] Caller requesting NON-URGENT health information AND [2] PCP's office is the best resource  Answer Assessment - Initial Assessment Questions 1. REASON FOR CALL or QUESTION: "What is your reason for calling today?" or "How can I best help you?" or "What question do you have that I can help answer?"     Can having  covid 19 affect pregnancy ? Patient is "a few weeks" pregnant.  Protocols used: Information Only Call - No Triage-A-AH

## 2021-12-23 NOTE — Telephone Encounter (Signed)
This would be a question for her OB/GYN as I do not manage obstetric patients.

## 2021-12-23 NOTE — Telephone Encounter (Signed)
Routing to PCP for review.

## 2021-12-24 NOTE — Telephone Encounter (Signed)
Call placed to patient and a Vm left informing her to return call.

## 2022-01-10 DIAGNOSIS — O210 Mild hyperemesis gravidarum: Secondary | ICD-10-CM | POA: Diagnosis not present

## 2022-01-10 DIAGNOSIS — Z3A09 9 weeks gestation of pregnancy: Secondary | ICD-10-CM | POA: Diagnosis not present

## 2022-01-10 DIAGNOSIS — O3680X0 Pregnancy with inconclusive fetal viability, not applicable or unspecified: Secondary | ICD-10-CM | POA: Diagnosis not present

## 2022-01-10 DIAGNOSIS — O3441 Maternal care for other abnormalities of cervix, first trimester: Secondary | ICD-10-CM | POA: Diagnosis not present

## 2022-01-10 DIAGNOSIS — O09521 Supervision of elderly multigravida, first trimester: Secondary | ICD-10-CM | POA: Diagnosis not present

## 2022-01-10 DIAGNOSIS — Z369 Encounter for antenatal screening, unspecified: Secondary | ICD-10-CM | POA: Diagnosis not present

## 2022-01-10 DIAGNOSIS — O34219 Maternal care for unspecified type scar from previous cesarean delivery: Secondary | ICD-10-CM | POA: Diagnosis not present

## 2022-01-10 DIAGNOSIS — D251 Intramural leiomyoma of uterus: Secondary | ICD-10-CM | POA: Diagnosis not present

## 2022-01-10 DIAGNOSIS — Z3A08 8 weeks gestation of pregnancy: Secondary | ICD-10-CM | POA: Diagnosis not present

## 2022-01-16 ENCOUNTER — Telehealth: Payer: Medicaid Other

## 2022-02-07 DIAGNOSIS — O09521 Supervision of elderly multigravida, first trimester: Secondary | ICD-10-CM | POA: Diagnosis not present

## 2022-02-07 DIAGNOSIS — Z3A12 12 weeks gestation of pregnancy: Secondary | ICD-10-CM | POA: Diagnosis not present

## 2022-02-07 DIAGNOSIS — O418X10 Other specified disorders of amniotic fluid and membranes, first trimester, not applicable or unspecified: Secondary | ICD-10-CM | POA: Diagnosis not present

## 2022-02-07 DIAGNOSIS — Z3481 Encounter for supervision of other normal pregnancy, first trimester: Secondary | ICD-10-CM | POA: Diagnosis not present

## 2022-02-07 DIAGNOSIS — O468X1 Other antepartum hemorrhage, first trimester: Secondary | ICD-10-CM | POA: Diagnosis not present

## 2022-04-04 DIAGNOSIS — O09512 Supervision of elderly primigravida, second trimester: Secondary | ICD-10-CM | POA: Diagnosis not present

## 2022-04-04 DIAGNOSIS — O99212 Obesity complicating pregnancy, second trimester: Secondary | ICD-10-CM | POA: Diagnosis not present

## 2022-04-04 DIAGNOSIS — Z3689 Encounter for other specified antenatal screening: Secondary | ICD-10-CM | POA: Diagnosis not present

## 2022-05-02 DIAGNOSIS — Z362 Encounter for other antenatal screening follow-up: Secondary | ICD-10-CM | POA: Diagnosis not present

## 2022-05-09 DIAGNOSIS — O368120 Decreased fetal movements, second trimester, not applicable or unspecified: Secondary | ICD-10-CM | POA: Diagnosis not present

## 2022-06-06 DIAGNOSIS — O99213 Obesity complicating pregnancy, third trimester: Secondary | ICD-10-CM | POA: Diagnosis not present

## 2022-06-06 DIAGNOSIS — Z3689 Encounter for other specified antenatal screening: Secondary | ICD-10-CM | POA: Diagnosis not present

## 2022-06-06 DIAGNOSIS — O09523 Supervision of elderly multigravida, third trimester: Secondary | ICD-10-CM | POA: Diagnosis not present

## 2022-06-06 DIAGNOSIS — O321XX0 Maternal care for breech presentation, not applicable or unspecified: Secondary | ICD-10-CM | POA: Diagnosis not present

## 2022-06-10 DIAGNOSIS — O0993 Supervision of high risk pregnancy, unspecified, third trimester: Secondary | ICD-10-CM | POA: Diagnosis not present

## 2022-06-10 DIAGNOSIS — R7309 Other abnormal glucose: Secondary | ICD-10-CM | POA: Diagnosis not present

## 2022-06-13 DIAGNOSIS — O403XX0 Polyhydramnios, third trimester, not applicable or unspecified: Secondary | ICD-10-CM | POA: Diagnosis not present

## 2022-06-13 DIAGNOSIS — O365930 Maternal care for other known or suspected poor fetal growth, third trimester, not applicable or unspecified: Secondary | ICD-10-CM | POA: Diagnosis not present

## 2022-06-20 DIAGNOSIS — O365930 Maternal care for other known or suspected poor fetal growth, third trimester, not applicable or unspecified: Secondary | ICD-10-CM | POA: Diagnosis not present

## 2022-06-20 DIAGNOSIS — O09523 Supervision of elderly multigravida, third trimester: Secondary | ICD-10-CM | POA: Diagnosis not present

## 2022-06-20 DIAGNOSIS — O403XX0 Polyhydramnios, third trimester, not applicable or unspecified: Secondary | ICD-10-CM | POA: Diagnosis not present

## 2022-06-20 DIAGNOSIS — O24410 Gestational diabetes mellitus in pregnancy, diet controlled: Secondary | ICD-10-CM | POA: Diagnosis not present

## 2022-06-24 DIAGNOSIS — O0993 Supervision of high risk pregnancy, unspecified, third trimester: Secondary | ICD-10-CM | POA: Diagnosis not present

## 2022-06-27 DIAGNOSIS — O365930 Maternal care for other known or suspected poor fetal growth, third trimester, not applicable or unspecified: Secondary | ICD-10-CM | POA: Diagnosis not present

## 2022-06-27 DIAGNOSIS — O09523 Supervision of elderly multigravida, third trimester: Secondary | ICD-10-CM | POA: Diagnosis not present

## 2022-06-27 DIAGNOSIS — O99210 Obesity complicating pregnancy, unspecified trimester: Secondary | ICD-10-CM | POA: Diagnosis not present

## 2022-06-27 DIAGNOSIS — O403XX0 Polyhydramnios, third trimester, not applicable or unspecified: Secondary | ICD-10-CM | POA: Diagnosis not present

## 2022-06-27 DIAGNOSIS — O24410 Gestational diabetes mellitus in pregnancy, diet controlled: Secondary | ICD-10-CM | POA: Diagnosis not present

## 2022-07-01 DIAGNOSIS — O0993 Supervision of high risk pregnancy, unspecified, third trimester: Secondary | ICD-10-CM | POA: Diagnosis not present

## 2022-07-04 DIAGNOSIS — O403XX0 Polyhydramnios, third trimester, not applicable or unspecified: Secondary | ICD-10-CM | POA: Diagnosis not present

## 2022-07-04 DIAGNOSIS — O365930 Maternal care for other known or suspected poor fetal growth, third trimester, not applicable or unspecified: Secondary | ICD-10-CM | POA: Diagnosis not present

## 2022-07-04 DIAGNOSIS — O322XX0 Maternal care for transverse and oblique lie, not applicable or unspecified: Secondary | ICD-10-CM | POA: Diagnosis not present

## 2022-07-04 DIAGNOSIS — O09523 Supervision of elderly multigravida, third trimester: Secondary | ICD-10-CM | POA: Diagnosis not present

## 2022-07-04 DIAGNOSIS — O24410 Gestational diabetes mellitus in pregnancy, diet controlled: Secondary | ICD-10-CM | POA: Diagnosis not present

## 2023-07-03 ENCOUNTER — Other Ambulatory Visit: Payer: Self-pay | Admitting: Family Medicine

## 2023-07-03 DIAGNOSIS — Z1231 Encounter for screening mammogram for malignant neoplasm of breast: Secondary | ICD-10-CM

## 2023-07-07 ENCOUNTER — Ambulatory Visit
Admission: RE | Admit: 2023-07-07 | Discharge: 2023-07-07 | Disposition: A | Discharge status: Home / Self Care | Source: Ambulatory Visit | Attending: Family Medicine | Admitting: Family Medicine

## 2023-07-07 DIAGNOSIS — Z1231 Encounter for screening mammogram for malignant neoplasm of breast: Secondary | ICD-10-CM

## 2023-07-10 ENCOUNTER — Ambulatory Visit: Payer: Self-pay | Admitting: Family Medicine

## 2023-08-31 ENCOUNTER — Telehealth: Payer: Self-pay | Admitting: Family Medicine

## 2023-08-31 NOTE — Telephone Encounter (Signed)
 Called patient to confirm upcoming appointment 09/01/2023. Unable to reach patient or leave VM.

## 2023-09-01 ENCOUNTER — Encounter: Payer: Self-pay | Admitting: Family Medicine

## 2023-09-01 ENCOUNTER — Ambulatory Visit: Attending: Family Medicine | Admitting: Family Medicine

## 2023-09-01 VITALS — BP 124/83 | HR 65 | Ht 62.0 in | Wt 175.6 lb

## 2023-09-01 DIAGNOSIS — Z130 Encounter for screening for diseases of the blood and blood-forming organs and certain disorders involving the immune mechanism: Secondary | ICD-10-CM

## 2023-09-01 DIAGNOSIS — Z13220 Encounter for screening for lipoid disorders: Secondary | ICD-10-CM | POA: Diagnosis not present

## 2023-09-01 DIAGNOSIS — Z13228 Encounter for screening for other metabolic disorders: Secondary | ICD-10-CM | POA: Diagnosis not present

## 2023-09-01 DIAGNOSIS — Z0001 Encounter for general adult medical examination with abnormal findings: Secondary | ICD-10-CM

## 2023-09-01 DIAGNOSIS — M419 Scoliosis, unspecified: Secondary | ICD-10-CM | POA: Insufficient documentation

## 2023-09-01 DIAGNOSIS — Z131 Encounter for screening for diabetes mellitus: Secondary | ICD-10-CM

## 2023-09-01 DIAGNOSIS — Z136 Encounter for screening for cardiovascular disorders: Secondary | ICD-10-CM

## 2023-09-01 NOTE — Patient Instructions (Signed)
 Mantenimiento de Radiographer, therapeutic en las mujeres Health Maintenance, Female Adoptar un estilo de vida saludable y recibir atencin preventiva son importantes para promover la salud y Counsellor. Consulte al mdico sobre: El esquema adecuado para hacerse pruebas y exmenes peridicos. Cosas que puede hacer por su cuenta para prevenir enfermedades y [place] sano. Qu debo saber sobre la dieta, el peso y el ejercicio? Consuma una dieta saludable  Consuma una dieta que incluya muchas verduras, frutas, productos lcteos con bajo contenido de [place] y Associate Professor. No consuma muchos alimentos ricos en grasas slidas, azcares agregados o sodio. Mantenga un peso saludable El ndice de masa muscular [facility]) se [place] para identificar problemas de [place]. Proporciona una estimacin de la grasa corporal basndose en el peso y la altura. Su mdico puede ayudarle a Engineer, site IMC y a Personnel officer o Pharmacologist un peso saludable. Haga ejercicio con regularidad Haga ejercicio con regularidad. Esta es una de las prcticas ms importantes que puede hacer por su salud. La [employer] de los adultos deben seguir estas pautas: Education officer, environmental, al menos, 150 minutos de actividad fsica por semana. El ejercicio debe aumentar la frecuencia cardaca y Media planner transpirar (ejercicio de intensidad moderada). Hacer ejercicios de fortalecimiento por lo [employer] por semana. Agregue esto a su plan de ejercicio de intensidad moderada. Pase menos tiempo sentada. Incluso la actividad fsica ligera puede ser beneficiosa. Controle sus niveles de colesterol y lpidos en la sangre Comience a realizarse anlisis de lpidos y Oncologist en la sangre a los 20 aos y luego reptalos cada 5 aos. Hgase controlar los niveles de colesterol con mayor frecuencia si: Sus niveles de lpidos y colesterol son altos. Es mayor de 40 aos. Presenta un alto riesgo de padecer enfermedades cardacas. Qu debo saber sobre las pruebas de deteccin del  cncer? Segn su historia clnica y sus antecedentes familiares, es posible que deba realizarse pruebas de deteccin del cncer en diferentes edades. Esto puede incluir pruebas de deteccin de lo siguiente: Cncer de mama. Cncer de cuello uterino. Cncer colorrectal. Cncer de piel. Cncer de pulmn. Qu debo saber sobre la enfermedad cardaca, la diabetes y la hipertensin arterial? Presin arterial y enfermedad cardaca La hipertensin arterial causa enfermedades cardacas y [place] el riesgo de accidente cerebrovascular. Es ms probable que esto se manifieste en las personas que tienen lecturas de presin arterial alta o tienen sobrepeso. Hgase controlar la presin arterial: Cada 3 a 5 aos si tiene entre 18 y 50 aos. Todos los aos si es mayor de 40 aos. Diabetes Realcese exmenes de deteccin de la diabetes con regularidad. Este anlisis revisa el nivel de azcar en la sangre en [place]. Hgase las pruebas de deteccin: Cada tres aos despus de los 40 aos de edad si tiene un peso normal y un bajo riesgo de padecer diabetes. Con ms frecuencia y a partir de [place] edad inferior si tiene sobrepeso o un alto riesgo de padecer diabetes. Qu debo saber sobre la prevencin de infecciones? Hepatitis B Si tiene un riesgo ms alto de contraer hepatitis B, debe someterse a un examen de deteccin de este virus. Hable con el mdico para averiguar si tiene riesgo de contraer la infeccin por hepatitis B. Hepatitis C Se recomienda el anlisis a: [employer] 1945 y 1965. Todas las personas que tengan un riesgo de haber contrado hepatitis C. Enfermedades de transmisin sexual (ETS) Hgase las pruebas de Airline pilot de ITS, incluidas la gonorrea y la clamidia, si: Es sexualmente activa y es Adult nurse de 24  aos. Es mayor de 24 aos, y el mdico le informa que corre riesgo de tener este tipo de infecciones. La actividad sexual ha cambiado desde que le hicieron la ltima prueba de  deteccin y tiene un riesgo mayor de Warehouse manager clamidia o Copy. Pregntele al mdico si usted tiene riesgo. Pregntele al mdico si usted tiene un alto riesgo de Primary school teacher VIH. El mdico tambin puede recomendarle un medicamento recetado para ayudar a evitar la infeccin por el VIH. Si elige tomar medicamentos para prevenir el VIH, primero debe [employer] de deteccin del VIH. Luego debe hacerse anlisis cada 3 meses mientras est tomando los medicamentos. Embarazo Si est por dejar de Armed forces training and education officer (fase premenopusica) y usted puede quedar [place], busque asesoramiento antes de [place]. Tome de 400 a 800 microgramos (mcg) de cido [employer] si [place]. Pida mtodos de control de la natalidad (anticonceptivos) si desea evitar un embarazo no deseado. Osteoporosis y [place] La osteoporosis es una enfermedad en la que los huesos pierden los minerales y la fuerza por el avance de la edad. El resultado pueden ser fracturas en los [place]. Si tiene 65 aos o ms, o si est en riesgo de sufrir osteoporosis y fracturas, pregunte a su mdico si debe: Hacerse pruebas de deteccin de prdida sea. Tomar un suplemento de calcio o de vitamina D para reducir el riesgo de fracturas. Recibir terapia de reemplazo hormonal (TRH) para tratar los sntomas de la menopausia. Siga estas indicaciones en su casa: Consumo de alcohol No beba alcohol si: Su mdico le indica no hacerlo. Est embarazada, puede estar embarazada o est tratando de [place]. Si bebe alcohol: Limite la cantidad que bebe a lo siguiente: De 0 a 1 bebida por da. Sepa cunta cantidad de alcohol hay en las bebidas que toma. En los [address], una medida equivale a una botella de cerveza de 12 oz (355 ml), un vaso de vino de 5 oz (148 ml) o un vaso de una bebida alcohlica de alta graduacin de 1 oz (44 ml). Estilo de vida No consuma ningn producto que contenga nicotina o tabaco. Estos  productos incluyen cigarrillos, tabaco para Theatre manager y aparatos de vapeo, como los Administrator, Civil Service. Si necesita ayuda para dejar de consumir estos productos, consulte al mdico. No consuma drogas. No comparta agujas. Solicite ayuda a su mdico si necesita apoyo o informacin para abandonar las drogas. Indicaciones generales Realcese los estudios de rutina de [address], dentales y de Wellsite geologist. Mantngase al da con las vacunas. Infrmele a su mdico si: Se siente deprimida con frecuencia. Alguna vez ha sido vctima de [place] o no se siente seguro en su casa. Resumen Adoptar un estilo de vida saludable y recibir atencin preventiva son importantes para promover la salud y Counsellor. Siga las instrucciones del mdico acerca de una dieta saludable, el ejercicio y la realizacin de pruebas o exmenes para Hotel manager. Siga las instrucciones del mdico con respecto al control del colesterol y la presin arterial. Esta informacin no tiene Theme park manager el consejo del mdico. Asegrese de hacerle al mdico cualquier pregunta que tenga. Document Revised: 06/21/2020 Document Reviewed: 06/21/2020 Elsevier Patient Education  2024 [employer].

## 2023-09-01 NOTE — Progress Notes (Signed)
 Subjective:  Patient ID: Tammie Cook, female    DOB: [birth year]  Age: 43 y.o. MRN: [id number]  CC: Annual Exam     Discussed the use of AI scribe software for clinical note transcription with the patient, who gave verbal consent to proceed.  History of Present Illness Tammie Cook is a 43 year old female who presents for an annual physical exam.  Her last mammogram in June was normal, and her last Pap smear in 2022 was normal, with the next one due in June 2027. She is not yet due for a colonoscopy, which is typically recommended for individuals aged 44 and older.  She engages in regular physical activity, often exercising with her baby, and confirms regular dental visits.  She has a good intake of fruits and vegetables.  She has scoliosis.    No past medical history on file.  Past Surgical History:  Procedure Laterality Date   CESAREAN SECTION      Family History  Problem Relation Age of Onset   Heart disease Mother     Social History   Socioeconomic History   Marital status: Single    Spouse name: Not on file   Number of children: Not on file   Years of education: Not on file   Highest education level: Not on file  Occupational History   Not on file  Tobacco Use   Smoking status: Never   Smokeless tobacco: Never  Substance and Sexual Activity   Alcohol use: Never   Drug use: Never   Sexual activity: Yes    Birth control/protection: Condom  Other Topics Concern   Not on file  Social History Narrative   Not on file   Social Drivers of Health   Financial Resource Strain: Not on file  Food Insecurity: Low Risk  (08/05/2022)   Received from [facility]   Hunger Vital Sign    Within the past 12 months, you worried that your food would run out before you got money to buy more: Never true    Within the past 12 months, the food you bought just didn't last and you didn't have money to get more. : Never true  Transportation Needs: No  Transportation Needs (08/05/2022)   Received from [facility]    In the past 12 months, has lack of reliable transportation kept you from medical appointments, meetings, work or from getting things needed for daily living? : No  Physical Activity: Not on file  Stress: Not on file  Social Connections: Not on file    No Known Allergies  Outpatient Medications Prior to Visit  Medication Sig Dispense Refill   cetirizine  (ZYRTEC ) 10 MG tablet TOME UNA TABLETA TODOS LOS DIAS (Patient not taking: Reported on 09/01/2023) 30 tablet 1   ergocalciferol  (DRISDOL ) 1.25 MG (50000 UT) capsule Take 1 capsule (50,000 Units total) by mouth once a week. (Patient not taking: Reported on 09/01/2023) 12 capsule 1   ibuprofen  (ADVIL ) 600 MG tablet TOME UNA TABLETA CADA OCHO HORAS CUANDO SEA NECESARIO (Patient not taking: Reported on 09/01/2023) 60 tablet 1   olopatadine  (PATADAY ) 0.1 % ophthalmic solution Place 1 drop into both eyes 2 (two) times daily. (Patient not taking: Reported on 09/01/2023) 5 mL 1   omeprazole  (PRILOSEC) 40 MG capsule Take 1 capsule (40 mg total) by mouth daily. (Patient not taking: Reported on 09/01/2023) 30 capsule 6   No facility-administered medications prior to visit.     ROS Review  of Systems  Constitutional:  Negative for activity change and appetite change.  HENT:  Negative for sinus pressure and sore throat.   Respiratory:  Negative for chest tightness, shortness of breath and wheezing.   Cardiovascular:  Negative for chest pain and palpitations.  Gastrointestinal:  Negative for abdominal distention, abdominal pain and constipation.  Genitourinary: Negative.   Musculoskeletal: Negative.   Psychiatric/Behavioral:  Negative for behavioral problems and dysphoric mood.     Objective:  BP 124/83   Pulse 65   Ht 5' 2 (1.575 m)   Wt 175 lb 9.6 oz (79.7 kg)   LMP 08/07/2023   SpO2 100%   BMI 32.12 kg/m      09/01/2023    9:02 AM 12/11/2021    3:50 PM 06/04/2021     8:38 AM  BP/Weight  Systolic BP 124 129 130  Diastolic BP 83 85 87  Wt. (Lbs) 175.6 185.4 181  BMI 32.12 kg/m2 33.91 kg/m2 33.11 kg/m2      Physical Exam Constitutional:      General: She is not in acute distress.    Appearance: She is well-developed. She is not diaphoretic.  HENT:     Head: Normocephalic.     Right Ear: External ear normal.     Left Ear: External ear normal.     Nose: Nose normal.     Mouth/Throat:     Mouth: Mucous membranes are moist.  Eyes:     Extraocular Movements: Extraocular movements intact.     Conjunctiva/sclera: Conjunctivae normal.     Pupils: Pupils are equal, round, and reactive to light.  Neck:     Vascular: No JVD.  Cardiovascular:     Rate and Rhythm: Normal rate and regular rhythm.     Pulses: Normal pulses.     Heart sounds: Normal heart sounds. No murmur heard.    No gallop.  Pulmonary:     Effort: Pulmonary effort is normal. No respiratory distress.     Breath sounds: Normal breath sounds. No wheezing or rales.  Chest:     Chest wall: No tenderness.  Abdominal:     General: Bowel sounds are normal. There is no distension.     Palpations: Abdomen is soft. There is no mass.     Tenderness: There is no abdominal tenderness.  Musculoskeletal:        General: Deformity (scoliosis) present. No tenderness. Normal range of motion.     Cervical back: Normal range of motion.  Skin:    General: Skin is warm and dry.  Neurological:     Mental Status: She is alert and oriented to person, place, and time.     Deep Tendon Reflexes: Reflexes are normal and symmetric.  Psychiatric:        Mood and Affect: Mood normal.        Latest Ref Rng & Units 06/04/2021    8:54 AM 06/20/2020    3:20 PM 03/16/2019   10:33 AM  CMP  Glucose 70 - 99 mg/dL 898  888  98   BUN 6 - 24 mg/dL 13  11  10    Creatinine 0.57 - 1.00 mg/dL 9.32  9.39  9.38   Sodium 134 - 144 mmol/L 137  137  136   Potassium 3.5 - 5.2 mmol/L 4.8  4.3  4.6   Chloride 96 - 106  mmol/L 101  101  103   CO2 20 - 29 mmol/L 19  20  21    Calcium 8.7 -  10.2 mg/dL 9.3  9.5  9.3   Total Protein 6.0 - 8.5 g/dL  7.3  7.4   Total Bilirubin 0.0 - 1.2 mg/dL  0.2  0.4   Alkaline Phos 44 - 121 IU/L  42  43   AST 0 - 40 IU/L  15  14   ALT 0 - 32 IU/L  17  13     Lipid Panel     Component Value Date/Time   CHOL 178 03/16/2019 1033   TRIG 106 03/16/2019 1033   HDL 46 03/16/2019 1033   CHOLHDL 3.9 03/16/2019 1033   LDLCALC 113 (H) 03/16/2019 1033    CBC    Component Value Date/Time   WBC 6.3 06/04/2021 0854   RBC 4.71 06/04/2021 0854   HGB 13.5 06/04/2021 0854   HCT 41.1 06/04/2021 0854   PLT 233 06/04/2021 0854   MCV 87 06/04/2021 0854   MCH 28.7 06/04/2021 0854   MCHC 32.8 06/04/2021 0854   RDW 12.7 06/04/2021 0854   LYMPHSABS 2.0 06/04/2021 0854   EOSABS 0.5 (H) 06/04/2021 0854   BASOSABS 0.1 06/04/2021 0854    Lab Results  Component Value Date   HGBA1C 5.7 (A) 06/04/2021      1. Annual visit for general adult medical examination with abnormal findings (Primary) Counseled on 150 minutes of exercise per week, healthy eating (including decreased daily intake of saturated fats, cholesterol, added sugars, sodium), routine healthcare maintenance.   2. Screening for metabolic disorder - CMP14+EGFR  3. Screening for iron deficiency anemia - CBC with Differential/Platelet  4. Encounter for lipid screening for cardiovascular disease  LP+Non-HDL Cholesterol  5. Screening for diabetes mellitus Last A1c was 8.7 in prediabetic range - Hemoglobin A1c  6. Scoliosis, unspecified scoliosis type, unspecified spinal region Stable   No orders of the defined types were placed in this encounter.   Follow-up: Return in about 1 month (around 10/02/2023) for CPE/ Preventive Health Exam.       Corrina Sabin, MD, FAAFP. [facility] [place], [place] [phone]   09/01/2023, 9:19 AM

## 2023-09-02 ENCOUNTER — Ambulatory Visit: Payer: Self-pay | Admitting: Family Medicine

## 2023-09-02 LAB — CBC WITH DIFFERENTIAL/PLATELET
Basophils Absolute: 0.1 x10E3/uL (ref 0.0–0.2)
Basos: 1 %
EOS (ABSOLUTE): 0.2 x10E3/uL (ref 0.0–0.4)
Eos: 3 %
Hematocrit: 43.4 % (ref 34.0–46.6)
Hemoglobin: 13.8 g/dL (ref 11.1–15.9)
Immature Grans (Abs): 0 x10E3/uL (ref 0.0–0.1)
Immature Granulocytes: 0 %
Lymphocytes Absolute: 2.4 x10E3/uL (ref 0.7–3.1)
Lymphs: 31 %
MCH: 28.7 pg (ref 26.6–33.0)
MCHC: 31.8 g/dL (ref 31.5–35.7)
MCV: 90 fL (ref 79–97)
Monocytes Absolute: 0.6 x10E3/uL (ref 0.1–0.9)
Monocytes: 8 %
Neutrophils Absolute: 4.4 x10E3/uL (ref 1.4–7.0)
Neutrophils: 57 %
Platelets: 253 x10E3/uL (ref 150–450)
RBC: 4.81 x10E6/uL (ref 3.77–5.28)
RDW: 12.4 % (ref 11.7–15.4)
WBC: 7.6 x10E3/uL (ref 3.4–10.8)

## 2023-09-02 LAB — CMP14+EGFR
ALT: 24 IU/L (ref 0–32)
AST: 17 IU/L (ref 0–40)
Albumin: 4.6 g/dL (ref 3.9–4.9)
Alkaline Phosphatase: 45 IU/L (ref 44–121)
BUN/Creatinine Ratio: 12 (ref 9–23)
BUN: 8 mg/dL (ref 6–24)
Bilirubin Total: 0.5 mg/dL (ref 0.0–1.2)
CO2: 20 mmol/L (ref 20–29)
Calcium: 9.8 mg/dL (ref 8.7–10.2)
Chloride: 99 mmol/L (ref 96–106)
Creatinine, Ser: 0.69 mg/dL (ref 0.57–1.00)
Globulin, Total: 2.9 g/dL (ref 1.5–4.5)
Glucose: 105 mg/dL — ABNORMAL HIGH (ref 70–99)
Potassium: 4.7 mmol/L (ref 3.5–5.2)
Sodium: 135 mmol/L (ref 134–144)
Total Protein: 7.5 g/dL (ref 6.0–8.5)
eGFR: 110 mL/min/1.73 (ref >59)

## 2023-09-02 LAB — LP+NON-HDL CHOLESTEROL
Cholesterol, Total: 192 mg/dL (ref 100–199)
HDL: 43 mg/dL (ref >39)
LDL Chol Calc (NIH): 120 mg/dL — ABNORMAL HIGH (ref 0–99)
Total Non-HDL-Chol (LDL+VLDL): 149 mg/dL — ABNORMAL HIGH (ref 0–129)
Triglycerides: 161 mg/dL — ABNORMAL HIGH (ref 0–149)
VLDL Cholesterol Cal: 29 mg/dL (ref 5–40)

## 2023-09-02 LAB — HEMOGLOBIN A1C
Est. average glucose Bld gHb Est-mCnc: 117 mg/dL
Hgb A1c MFr Bld: 5.7 % — ABNORMAL HIGH (ref 4.8–5.6)
